# Patient Record
Sex: Female | Born: 1940 | Race: White | Hispanic: No | Smoking: Never smoker
Health system: Southern US, Community
[De-identification: ages and names within clinical notes are randomized; demographics above are authoritative.]

## PROBLEM LIST (undated history)

## (undated) DIAGNOSIS — I509 Heart failure, unspecified: Secondary | ICD-10-CM

## (undated) DIAGNOSIS — M1990 Unspecified osteoarthritis, unspecified site: Secondary | ICD-10-CM

## (undated) DIAGNOSIS — I2510 Atherosclerotic heart disease of native coronary artery without angina pectoris: Secondary | ICD-10-CM

## (undated) DIAGNOSIS — N289 Disorder of kidney and ureter, unspecified: Secondary | ICD-10-CM

## (undated) DIAGNOSIS — E119 Type 2 diabetes mellitus without complications: Secondary | ICD-10-CM

## (undated) DIAGNOSIS — G4730 Sleep apnea, unspecified: Secondary | ICD-10-CM

## (undated) DIAGNOSIS — F329 Major depressive disorder, single episode, unspecified: Secondary | ICD-10-CM

## (undated) DIAGNOSIS — R609 Edema, unspecified: Secondary | ICD-10-CM

## (undated) DIAGNOSIS — I10 Essential (primary) hypertension: Secondary | ICD-10-CM

## (undated) DIAGNOSIS — F410 Panic disorder [episodic paroxysmal anxiety] without agoraphobia: Secondary | ICD-10-CM

## (undated) DIAGNOSIS — N189 Chronic kidney disease, unspecified: Secondary | ICD-10-CM

## (undated) DIAGNOSIS — F32A Depression, unspecified: Secondary | ICD-10-CM

## (undated) DIAGNOSIS — I639 Cerebral infarction, unspecified: Secondary | ICD-10-CM

## (undated) DIAGNOSIS — IMO0001 Reserved for inherently not codable concepts without codable children: Secondary | ICD-10-CM

## (undated) DIAGNOSIS — R0601 Orthopnea: Secondary | ICD-10-CM

## (undated) HISTORY — PX: CORONARY ANGIOPLASTY: SHX604

## (undated) HISTORY — PX: TONSILLECTOMY: SUR1361

## (undated) HISTORY — PX: NO PAST SURGERIES: SHX2092

---

## 2005-10-25 ENCOUNTER — Emergency Department: Payer: Self-pay | Admitting: Unknown Physician Specialty

## 2005-10-25 ENCOUNTER — Other Ambulatory Visit: Payer: Self-pay

## 2008-08-14 ENCOUNTER — Encounter: Payer: Self-pay | Admitting: Unknown Physician Specialty

## 2008-08-22 ENCOUNTER — Encounter: Payer: Self-pay | Admitting: Unknown Physician Specialty

## 2011-11-22 ENCOUNTER — Ambulatory Visit: Payer: Self-pay | Admitting: Family Medicine

## 2011-11-23 ENCOUNTER — Ambulatory Visit: Payer: Self-pay | Admitting: Family Medicine

## 2011-12-24 ENCOUNTER — Ambulatory Visit: Payer: Self-pay | Admitting: Family Medicine

## 2012-01-21 ENCOUNTER — Ambulatory Visit: Payer: Self-pay | Admitting: Family Medicine

## 2015-03-11 ENCOUNTER — Ambulatory Visit
Admit: 2015-03-11 | Disposition: A | Discharge status: Home / Self Care | Payer: Self-pay | Attending: Nephrology | Admitting: Nephrology

## 2016-06-07 ENCOUNTER — Encounter: Payer: Self-pay | Admitting: *Deleted

## 2016-06-15 ENCOUNTER — Ambulatory Visit
Admission: RE | Admit: 2016-06-15 | Discharge: 2016-06-15 | Disposition: A | Discharge status: Home / Self Care | Payer: Medicare HMO | Source: Ambulatory Visit | Attending: Ophthalmology | Admitting: Ophthalmology

## 2016-06-15 ENCOUNTER — Ambulatory Visit: Payer: Medicare HMO | Admitting: Anesthesiology

## 2016-06-15 ENCOUNTER — Encounter
Admission: RE | Disposition: A | Discharge status: Home / Self Care | Payer: Self-pay | Source: Ambulatory Visit | Attending: Ophthalmology

## 2016-06-15 ENCOUNTER — Encounter: Payer: Self-pay | Admitting: *Deleted

## 2016-06-15 DIAGNOSIS — F419 Anxiety disorder, unspecified: Secondary | ICD-10-CM | POA: Insufficient documentation

## 2016-06-15 DIAGNOSIS — Z87891 Personal history of nicotine dependence: Secondary | ICD-10-CM | POA: Insufficient documentation

## 2016-06-15 DIAGNOSIS — M1990 Unspecified osteoarthritis, unspecified site: Secondary | ICD-10-CM | POA: Insufficient documentation

## 2016-06-15 DIAGNOSIS — H2511 Age-related nuclear cataract, right eye: Secondary | ICD-10-CM | POA: Diagnosis not present

## 2016-06-15 DIAGNOSIS — G4730 Sleep apnea, unspecified: Secondary | ICD-10-CM | POA: Insufficient documentation

## 2016-06-15 DIAGNOSIS — I10 Essential (primary) hypertension: Secondary | ICD-10-CM | POA: Diagnosis not present

## 2016-06-15 DIAGNOSIS — I509 Heart failure, unspecified: Secondary | ICD-10-CM | POA: Insufficient documentation

## 2016-06-15 DIAGNOSIS — E1165 Type 2 diabetes mellitus with hyperglycemia: Secondary | ICD-10-CM | POA: Insufficient documentation

## 2016-06-15 DIAGNOSIS — E7800 Pure hypercholesterolemia, unspecified: Secondary | ICD-10-CM | POA: Insufficient documentation

## 2016-06-15 DIAGNOSIS — F329 Major depressive disorder, single episode, unspecified: Secondary | ICD-10-CM | POA: Insufficient documentation

## 2016-06-15 DIAGNOSIS — Z8673 Personal history of transient ischemic attack (TIA), and cerebral infarction without residual deficits: Secondary | ICD-10-CM | POA: Diagnosis not present

## 2016-06-15 DIAGNOSIS — Z955 Presence of coronary angioplasty implant and graft: Secondary | ICD-10-CM | POA: Insufficient documentation

## 2016-06-15 DIAGNOSIS — I2510 Atherosclerotic heart disease of native coronary artery without angina pectoris: Secondary | ICD-10-CM | POA: Diagnosis not present

## 2016-06-15 HISTORY — DX: Depression, unspecified: F32.A

## 2016-06-15 HISTORY — DX: Essential (primary) hypertension: I10

## 2016-06-15 HISTORY — DX: Sleep apnea, unspecified: G47.30

## 2016-06-15 HISTORY — DX: Orthopnea: R06.01

## 2016-06-15 HISTORY — DX: Heart failure, unspecified: I50.9

## 2016-06-15 HISTORY — DX: Unspecified osteoarthritis, unspecified site: M19.90

## 2016-06-15 HISTORY — DX: Panic disorder (episodic paroxysmal anxiety): F41.0

## 2016-06-15 HISTORY — DX: Major depressive disorder, single episode, unspecified: F32.9

## 2016-06-15 HISTORY — DX: Type 2 diabetes mellitus without complications: E11.9

## 2016-06-15 HISTORY — DX: Chronic kidney disease, unspecified: N18.9

## 2016-06-15 HISTORY — DX: Edema, unspecified: R60.9

## 2016-06-15 HISTORY — PX: CATARACT EXTRACTION W/PHACO: SHX586

## 2016-06-15 HISTORY — DX: Cerebral infarction, unspecified: I63.9

## 2016-06-15 HISTORY — DX: Atherosclerotic heart disease of native coronary artery without angina pectoris: I25.10

## 2016-06-15 HISTORY — DX: Reserved for inherently not codable concepts without codable children: IMO0001

## 2016-06-15 LAB — GLUCOSE, CAPILLARY: Glucose-Capillary: 203 mg/dL — ABNORMAL HIGH (ref 65–99)

## 2016-06-15 SURGERY — PHACOEMULSIFICATION, CATARACT, WITH IOL INSERTION
Anesthesia: Monitor Anesthesia Care | Site: Eye | Laterality: Right | Wound class: Clean

## 2016-06-15 MED ORDER — NA CHONDROIT SULF-NA HYALURON 40-17 MG/ML IO SOLN
INTRAOCULAR | Status: DC | PRN
  Administered 2016-06-15: 1 mL via INTRAOCULAR

## 2016-06-15 MED ORDER — SODIUM CHLORIDE 0.9 % IV SOLN
INTRAVENOUS | Status: DC
  Administered 2016-06-15: 11:00:00 via INTRAVENOUS

## 2016-06-15 MED ORDER — ARMC OPHTHALMIC DILATING GEL
1.0000 "application " | OPHTHALMIC | Status: AC | PRN
  Administered 2016-06-15 (×2): 1 via OPHTHALMIC

## 2016-06-15 MED ORDER — POVIDONE-IODINE 5 % OP SOLN
OPHTHALMIC | Status: AC
  Filled 2016-06-15: qty 30

## 2016-06-15 MED ORDER — MOXIFLOXACIN HCL 0.5 % OP SOLN: 1.0000 [drp] | OPHTHALMIC | Status: DC | PRN

## 2016-06-15 MED ORDER — ONDANSETRON HCL 4 MG/2ML IJ SOLN
INTRAMUSCULAR | Status: DC | PRN
  Administered 2016-06-15: 4 mg via INTRAVENOUS

## 2016-06-15 MED ORDER — CEFUROXIME OPHTHALMIC INJECTION 1 MG/0.1 ML
INJECTION | OPHTHALMIC | Status: DC | PRN
  Administered 2016-06-15: 0.1 mL via INTRACAMERAL

## 2016-06-15 MED ORDER — MOXIFLOXACIN HCL 0.5 % OP SOLN
OPHTHALMIC | Status: DC | PRN
  Administered 2016-06-15: 1 [drp] via OPHTHALMIC

## 2016-06-15 MED ORDER — LIDOCAINE HCL (PF) 4 % IJ SOLN
INTRAMUSCULAR | Status: AC
  Filled 2016-06-15: qty 5

## 2016-06-15 MED ORDER — LIDOCAINE HCL (PF) 1 % IJ SOLN
INTRAMUSCULAR | Status: AC
  Filled 2016-06-15: qty 2

## 2016-06-15 MED ORDER — EPINEPHRINE HCL 1 MG/ML IJ SOLN
INTRAOCULAR | Status: DC | PRN
  Administered 2016-06-15: 1 mL via OPHTHALMIC

## 2016-06-15 MED ORDER — CARBACHOL 0.01 % IO SOLN
INTRAOCULAR | Status: DC | PRN
  Administered 2016-06-15: 0.5 mL via INTRAOCULAR

## 2016-06-15 MED ORDER — ARMC OPHTHALMIC DILATING GEL
OPHTHALMIC | Status: DC
Start: 2016-06-15 — End: 2016-06-15
  Filled 2016-06-15: qty 0.25

## 2016-06-15 MED ORDER — TETRACAINE HCL 0.5 % OP SOLN
1.0000 [drp] | Freq: Once | OPHTHALMIC | Status: AC
  Administered 2016-06-15: 1 [drp] via OPHTHALMIC

## 2016-06-15 MED ORDER — FENTANYL CITRATE (PF) 100 MCG/2ML IJ SOLN
INTRAMUSCULAR | Status: DC | PRN
  Administered 2016-06-15: 50 ug via INTRAVENOUS

## 2016-06-15 MED ORDER — DEXMEDETOMIDINE HCL 200 MCG/2ML IV SOLN
INTRAVENOUS | Status: DC | PRN
  Administered 2016-06-15: 350 ug via INTRAVENOUS

## 2016-06-15 MED ORDER — EPINEPHRINE HCL 1 MG/ML IJ SOLN
INTRAMUSCULAR | Status: AC
  Filled 2016-06-15: qty 1

## 2016-06-15 MED ORDER — POVIDONE-IODINE 5 % OP SOLN
1.0000 "application " | Freq: Once | OPHTHALMIC | Status: AC
  Administered 2016-06-15: 1 via OPHTHALMIC

## 2016-06-15 MED ORDER — MOXIFLOXACIN HCL 0.5 % OP SOLN
OPHTHALMIC | Status: AC
  Filled 2016-06-15: qty 3

## 2016-06-15 SURGICAL SUPPLY — 21 items
CANNULA ANT/CHMB 27GA (MISCELLANEOUS) ×3 IMPLANT
CUP MEDICINE 2OZ PLAST GRAD ST (MISCELLANEOUS) ×3 IMPLANT
GLOVE BIO SURGEON STRL SZ8 (GLOVE) ×3 IMPLANT
GLOVE BIOGEL M 6.5 STRL (GLOVE) ×3 IMPLANT
GLOVE SURG LX 8.0 MICRO (GLOVE) ×2
GLOVE SURG LX STRL 8.0 MICRO (GLOVE) ×1 IMPLANT
GOWN STRL REUS W/ TWL LRG LVL3 (GOWN DISPOSABLE) ×2 IMPLANT
GOWN STRL REUS W/TWL LRG LVL3 (GOWN DISPOSABLE) ×4
LENS IOL TECNIS ITEC 19.5 (Intraocular Lens) ×3 IMPLANT
PACK CATARACT (MISCELLANEOUS) ×3 IMPLANT
PACK CATARACT BRASINGTON LX (MISCELLANEOUS) ×3 IMPLANT
PACK EYE AFTER SURG (MISCELLANEOUS) ×3 IMPLANT
SOL BSS BAG (MISCELLANEOUS) ×3
SOL PREP PVP 2OZ (MISCELLANEOUS) ×3
SOLUTION BSS BAG (MISCELLANEOUS) ×1 IMPLANT
SOLUTION PREP PVP 2OZ (MISCELLANEOUS) ×1 IMPLANT
SYR 3ML LL SCALE MARK (SYRINGE) ×3 IMPLANT
SYR 5ML LL (SYRINGE) ×3 IMPLANT
SYR TB 1ML 27GX1/2 LL (SYRINGE) ×3 IMPLANT
WATER STERILE IRR 1000ML POUR (IV SOLUTION) ×3 IMPLANT
WIPE NON LINTING 3.25X3.25 (MISCELLANEOUS) ×3 IMPLANT

## 2016-06-15 NOTE — Op Note (Signed)
PREOPERATIVE DIAGNOSIS:  Nuclear sclerotic cataract of the right eye.   POSTOPERATIVE DIAGNOSIS: NUCLEAR SCLERTOIC CATARACT RIGHT EYE   OPERATIVE PROCEDURE:  Procedure(s): CATARACT EXTRACTION PHACO AND INTRAOCULAR LENS PLACEMENT (IOC)   SURGEON:  Galen Manila, MD.   ANESTHESIA:  Anesthesiologist: Rosaria Ferries, MD CRNA: Darrol Jump, CRNA  1.      Managed anesthesia care. 2.      Topical tetracaine drops followed by 2% Xylocaine jelly applied in the preoperative holding area.   COMPLICATIONS:  None.   TECHNIQUE:   Stop and chop   DESCRIPTION OF PROCEDURE:  The patient was examined and consented in the preoperative holding area where the aforementioned topical anesthesia was applied to the right eye and then brought back to the Operating Room where the right eye was prepped and draped in the usual sterile ophthalmic fashion and a lid speculum was placed. A paracentesis was created with the side port blade and the anterior chamber was filled with viscoelastic. A near clear corneal incision was performed with the steel keratome. A continuous curvilinear capsulorrhexis was performed with a cystotome followed by the capsulorrhexis forceps. Hydrodissection and hydrodelineation were carried out with BSS on a blunt cannula. The lens was removed in a stop and chop  technique and the remaining cortical material was removed with the irrigation-aspiration handpiece. The capsular bag was inflated with viscoelastic and the Technis [id number]  lens was placed in the capsular bag without complication. The remaining viscoelastic was removed from the eye with the irrigation-aspiration handpiece. The wounds were hydrated. The anterior chamber was flushed with Miostat and the eye was inflated to physiologic pressure. 0.1 mL of cefuroxime concentration 10 mg/mL was placed in the anterior chamber. The wounds were found to be water tight. The eye was dressed with Vigamox. The patient was given protective glasses  to wear throughout the day and a shield with which to sleep tonight. The patient was also given drops with which to begin a drop regimen today and will follow-up with me in one day.  Implant Name Type Inv. Item Serial No. Manufacturer Lot No. LRB No. Used  LENS IOL DIOP 19.5 - [id number] Intraocular Lens LENS IOL DIOP 19.5 [id number]   Right 1   Procedure(s) with comments: CATARACT EXTRACTION PHACO AND INTRAOCULAR LENS PLACEMENT (IOC) (Right) - [place] 1.53 AP% 21.4 CDE 24.41 Fluid bag lot # [id number]  Electronically signed: Breland Elders LOUIS 06/15/2016 1:03 PM

## 2016-06-15 NOTE — Anesthesia Preprocedure Evaluation (Signed)
Anesthesia Evaluation  Patient identified by MRN, date of birth, ID band Patient awake    Reviewed: Allergy & Precautions, H&P , NPO status , Patient's Chart, lab work & pertinent test results  History of Anesthesia Complications Negative for: history of anesthetic complications  Airway Mallampati: III  TM Distance: <3 FB Neck ROM: limited    Dental  (+) Poor Dentition, Chipped, Missing, Upper Dentures, Lower Dentures   Pulmonary shortness of breath, sleep apnea , former smoker,    Pulmonary exam normal breath sounds clear to auscultation       Cardiovascular Exercise Tolerance: Poor hypertension, + CAD, +CHF, + Orthopnea, + PND and + DOE  Normal cardiovascular exam Rhythm:regular Rate:Normal     Neuro/Psych PSYCHIATRIC DISORDERS Anxiety Depression CVA, Residual Symptoms    GI/Hepatic negative GI ROS, Neg liver ROS,   Endo/Other  diabetes, Poorly Controlled, Type 2  Renal/GU Renal disease  negative genitourinary   Musculoskeletal  (+) Arthritis ,   Abdominal   Peds  Hematology negative hematology ROS (+)   Anesthesia Other Findings Past Medical History: No date: Arthritis No date: CHF (congestive heart failure) (HCC) No date: Chronic kidney disease     Comment: RENAL INSUFF No date: Coronary artery disease No date: Depression No date: Diabetes mellitus without complication (HCC) No date: Edema     Comment: LEGS/FEET No date: Hypertension No date: Panic attacks No date: Shortness of breath dyspnea     Comment: WITH HEAT OR EXERTION No date: Sleep apnea     Comment: NO CPAP No date: Sleeps in sitting position due to orthopnea No date: Stroke [facility])     Comment: X 2    2000/ 2001   TIA 1990S  Past Surgical History: No date: CORONARY ANGIOPLASTY     Comment: STENTS 2001 No date: TONSILLECTOMY  BMI    Body Mass Index:  44.29 kg/m      Reproductive/Obstetrics negative OB ROS                              Anesthesia Physical Anesthesia Plan  ASA: IV  Anesthesia Plan: MAC   Post-op Pain Management:    Induction:   Airway Management Planned:   Additional Equipment:   Intra-op Plan:   Post-operative Plan:   Informed Consent: I have reviewed the patients History and Physical, chart, labs and discussed the procedure including the risks, benefits and alternatives for the proposed anesthesia with the patient or authorized representative who has indicated his/her understanding and acceptance.   Dental Advisory Given  Plan Discussed with: Anesthesiologist, CRNA and Surgeon  Anesthesia Plan Comments:         Anesthesia Quick Evaluation

## 2016-06-15 NOTE — Transfer of Care (Signed)
Immediate Anesthesia Transfer of Care Note  Patient: Emily Osborn  Procedure(s) Performed: Procedure(s) with comments: CATARACT EXTRACTION PHACO AND INTRAOCULAR LENS PLACEMENT (IOC) (Right) - [place] 1.53 AP% 21.4 CDE 24.41 Fluid bag lot # [id number]  Patient Location: PACU  Anesthesia Type:MAC  Level of Consciousness: awake, alert , oriented and patient cooperative  Airway & Oxygen Therapy: Patient Spontanous Breathing  Post-op Assessment: Report given to RN and Post -op Vital signs reviewed and stable  Post vital signs: Reviewed and stable  Last Vitals:  Vitals:   06/15/16 1057  BP: (!) 172/58  Pulse: 82  Resp: 18  Temp: 37.7 C    Last Pain:  Vitals:   06/15/16 1057  TempSrc: Tympanic         Complications: No apparent anesthesia complications

## 2016-06-15 NOTE — Discharge Instructions (Signed)
Eye Surgery Discharge Instructions  Expect mild scratchy sensation or mild soreness. DO NOT RUB YOUR EYE!  The day of surgery:  Minimal physical activity, but bed rest is not required  No reading, computer work, or close hand work  No bending, lifting, or straining.  May watch TV  For 24 hours:  No driving, legal decisions, or alcoholic beverages  Safety precautions  Eat anything you prefer: It is better to start with liquids, then soup then solid foods.  _____ Eye patch should be worn until postoperative exam tomorrow.  ____ Solar shield eyeglasses should be worn for comfort in the sunlight/patch while sleeping  Resume all regular medications including aspirin or Coumadin if these were discontinued prior to surgery. You may shower, bathe, shave, or wash your hair. Tylenol may be taken for mild discomfort.  Call your doctor if you experience significant pain, nausea, or vomiting, fever > 101 or other signs of infection. [phone] or [phone] Specific instructions:  Follow-up Information    Emily Osborn,Emily LOUIS, MD Follow up on 06/16/2016.   Specialty:  Ophthalmology Why:  July 26 at 10:50am Contact information: [address] [place] [address] [phone]

## 2016-06-15 NOTE — H&P (Signed)
  All labs reviewed. Abnormal studies sent to patients PCP when indicated.  Previous H&P reviewed, patient examined, there are NO CHANGES.  Emily Osborn LOUIS7/25/201712:01 PM

## 2016-06-15 NOTE — Anesthesia Postprocedure Evaluation (Signed)
Anesthesia Post Note  Patient: Kristanna Maden  Procedure(s) Performed: Procedure(s) (LRB): CATARACT EXTRACTION PHACO AND INTRAOCULAR LENS PLACEMENT (IOC) (Right)  Patient location during evaluation: PACU Anesthesia Type: MAC Level of consciousness: awake and alert Pain management: pain level controlled Vital Signs Assessment: post-procedure vital signs reviewed and stable Respiratory status: spontaneous breathing, nonlabored ventilation, respiratory function stable and patient connected to nasal cannula oxygen Cardiovascular status: stable and blood pressure returned to baseline Anesthetic complications: no    Last Vitals:  Vitals:   06/15/16 1300 06/15/16 1323  BP: (!) 184/69 (!) 161/64  Pulse: 81 81  Resp: 18   Temp: 36.8 C     Last Pain:  Vitals:   06/15/16 1057  TempSrc: Tympanic                 Cleda Mccreedy Bayne Fosnaugh

## 2016-08-26 ENCOUNTER — Observation Stay
Admission: EM | Admit: 2016-08-26 | Discharge: 2016-08-27 | Disposition: A | Discharge status: Home / Self Care | Payer: Medicare HMO | Attending: Internal Medicine | Admitting: Internal Medicine

## 2016-08-26 ENCOUNTER — Observation Stay: Admit: 2016-08-26 | Payer: Medicare HMO

## 2016-08-26 ENCOUNTER — Observation Stay: Payer: Medicare HMO

## 2016-08-26 ENCOUNTER — Encounter: Payer: Self-pay | Admitting: Emergency Medicine

## 2016-08-26 ENCOUNTER — Emergency Department: Payer: Medicare HMO

## 2016-08-26 DIAGNOSIS — I509 Heart failure, unspecified: Secondary | ICD-10-CM | POA: Diagnosis not present

## 2016-08-26 DIAGNOSIS — Z951 Presence of aortocoronary bypass graft: Secondary | ICD-10-CM | POA: Insufficient documentation

## 2016-08-26 DIAGNOSIS — E785 Hyperlipidemia, unspecified: Secondary | ICD-10-CM | POA: Diagnosis not present

## 2016-08-26 DIAGNOSIS — I639 Cerebral infarction, unspecified: Secondary | ICD-10-CM

## 2016-08-26 DIAGNOSIS — M1990 Unspecified osteoarthritis, unspecified site: Secondary | ICD-10-CM | POA: Insufficient documentation

## 2016-08-26 DIAGNOSIS — I130 Hypertensive heart and chronic kidney disease with heart failure and stage 1 through stage 4 chronic kidney disease, or unspecified chronic kidney disease: Secondary | ICD-10-CM | POA: Diagnosis not present

## 2016-08-26 DIAGNOSIS — Z87891 Personal history of nicotine dependence: Secondary | ICD-10-CM | POA: Diagnosis not present

## 2016-08-26 DIAGNOSIS — F329 Major depressive disorder, single episode, unspecified: Secondary | ICD-10-CM | POA: Insufficient documentation

## 2016-08-26 DIAGNOSIS — G4730 Sleep apnea, unspecified: Secondary | ICD-10-CM | POA: Diagnosis not present

## 2016-08-26 DIAGNOSIS — R531 Weakness: Secondary | ICD-10-CM | POA: Diagnosis present

## 2016-08-26 DIAGNOSIS — E1122 Type 2 diabetes mellitus with diabetic chronic kidney disease: Secondary | ICD-10-CM | POA: Diagnosis not present

## 2016-08-26 DIAGNOSIS — Z8673 Personal history of transient ischemic attack (TIA), and cerebral infarction without residual deficits: Secondary | ICD-10-CM | POA: Diagnosis not present

## 2016-08-26 DIAGNOSIS — I6522 Occlusion and stenosis of left carotid artery: Secondary | ICD-10-CM | POA: Insufficient documentation

## 2016-08-26 DIAGNOSIS — Z794 Long term (current) use of insulin: Secondary | ICD-10-CM | POA: Insufficient documentation

## 2016-08-26 DIAGNOSIS — Z66 Do not resuscitate: Secondary | ICD-10-CM | POA: Diagnosis not present

## 2016-08-26 DIAGNOSIS — I2510 Atherosclerotic heart disease of native coronary artery without angina pectoris: Secondary | ICD-10-CM | POA: Insufficient documentation

## 2016-08-26 DIAGNOSIS — M25561 Pain in right knee: Secondary | ICD-10-CM | POA: Diagnosis not present

## 2016-08-26 DIAGNOSIS — R930 Abnormal findings on diagnostic imaging of skull and head, not elsewhere classified: Secondary | ICD-10-CM | POA: Diagnosis not present

## 2016-08-26 LAB — CBC WITH DIFFERENTIAL/PLATELET
BASOS ABS: 0.1 10*3/uL (ref 0–0.1)
Basophils Relative: 1 %
EOS PCT: 4 %
Eosinophils Absolute: 0.4 10*3/uL (ref 0–0.7)
HCT: 39.2 % (ref 35.0–47.0)
Hemoglobin: 12.9 g/dL (ref 12.0–16.0)
LYMPHS ABS: 1.5 10*3/uL (ref 1.0–3.6)
LYMPHS PCT: 14 %
MCH: 28.8 pg (ref 26.0–34.0)
MCHC: 33 g/dL (ref 32.0–36.0)
MCV: 87.4 fL (ref 80.0–100.0)
MONO ABS: 0.7 10*3/uL (ref 0.2–0.9)
MONOS PCT: 7 %
Neutro Abs: 7.6 10*3/uL — ABNORMAL HIGH (ref 1.4–6.5)
Neutrophils Relative %: 74 %
PLATELETS: 180 10*3/uL (ref 150–440)
RBC: 4.48 MIL/uL (ref 3.80–5.20)
RDW: 14.5 % (ref 11.5–14.5)
WBC: 10.2 10*3/uL (ref 3.6–11.0)

## 2016-08-26 LAB — COMPREHENSIVE METABOLIC PANEL
ALT: 15 U/L (ref 14–54)
ANION GAP: 9 (ref 5–15)
AST: 19 U/L (ref 15–41)
Albumin: 3.4 g/dL — ABNORMAL LOW (ref 3.5–5.0)
Alkaline Phosphatase: 80 U/L (ref 38–126)
BILIRUBIN TOTAL: 0.5 mg/dL (ref 0.3–1.2)
BUN: 37 mg/dL — ABNORMAL HIGH (ref 6–20)
CHLORIDE: 110 mmol/L (ref 101–111)
CO2: 21 mmol/L — ABNORMAL LOW (ref 22–32)
Calcium: 8.8 mg/dL — ABNORMAL LOW (ref 8.9–10.3)
Creatinine, Ser: 3.21 mg/dL — ABNORMAL HIGH (ref 0.44–1.00)
GFR, EST AFRICAN AMERICAN: 15 mL/min — AB (ref >60)
GFR, EST NON AFRICAN AMERICAN: 13 mL/min — AB (ref >60)
Glucose, Bld: 234 mg/dL — ABNORMAL HIGH (ref 65–99)
POTASSIUM: 4.2 mmol/L (ref 3.5–5.1)
Sodium: 140 mmol/L (ref 135–145)
TOTAL PROTEIN: 7.2 g/dL (ref 6.5–8.1)

## 2016-08-26 LAB — URINALYSIS COMPLETE WITH MICROSCOPIC (ARMC ONLY)
BILIRUBIN URINE: NEGATIVE
Bacteria, UA: NONE SEEN
Hgb urine dipstick: NEGATIVE
Ketones, ur: NEGATIVE mg/dL
LEUKOCYTES UA: NEGATIVE
NITRITE: NEGATIVE
Protein, ur: >500 mg/dL — AB
RBC / HPF: NONE SEEN RBC/hpf (ref 0–5)
Specific Gravity, Urine: 1.014 (ref 1.005–1.030)
pH: 6 (ref 5.0–8.0)

## 2016-08-26 LAB — SEDIMENTATION RATE: SED RATE: 60 mm/h — AB (ref 0–30)

## 2016-08-26 LAB — GLUCOSE, CAPILLARY
Glucose-Capillary: 225 mg/dL — ABNORMAL HIGH (ref 65–99)
Glucose-Capillary: 220 mg/dL — ABNORMAL HIGH (ref 65–99)
Glucose-Capillary: 389 mg/dL — ABNORMAL HIGH (ref 65–99)

## 2016-08-26 LAB — LACTIC ACID, PLASMA
LACTIC ACID, VENOUS: 1.1 mmol/L (ref 0.5–1.9)
LACTIC ACID, VENOUS: 1.6 mmol/L (ref 0.5–1.9)

## 2016-08-26 LAB — CK: Total CK: 41 U/L (ref 38–234)

## 2016-08-26 LAB — TROPONIN I: TROPONIN I: 0.03 ng/mL — AB (ref <0.03)

## 2016-08-26 MED ORDER — DOCUSATE SODIUM 100 MG PO CAPS: 100.0000 mg | ORAL_CAPSULE | Freq: Every day | ORAL | Status: DC | PRN

## 2016-08-26 MED ORDER — BUPIVACAINE HCL (PF) 0.5 % IJ SOLN
10.0000 mL | Freq: Once | INTRAMUSCULAR | Status: AC
  Administered 2016-08-26: 16:00:00 10 mL
  Filled 2016-08-26 (×2): qty 10

## 2016-08-26 MED ORDER — OXYCODONE HCL 5 MG PO TABS: 15.0000 mg | ORAL_TABLET | Freq: Two times a day (BID) | ORAL | Status: DC | PRN

## 2016-08-26 MED ORDER — SODIUM CHLORIDE 0.9% FLUSH
3.0000 mL | Freq: Two times a day (BID) | INTRAVENOUS | Status: DC
  Administered 2016-08-26 – 2016-08-27 (×3): 3 mL via INTRAVENOUS

## 2016-08-26 MED ORDER — ASPIRIN EC 81 MG PO TBEC
81.0000 mg | DELAYED_RELEASE_TABLET | Freq: Every day | ORAL | Status: DC
  Filled 2016-08-26: qty 1

## 2016-08-26 MED ORDER — PREDNISONE 20 MG PO TABS
30.0000 mg | ORAL_TABLET | Freq: Once | ORAL | Status: AC
  Administered 2016-08-26: 30 mg via ORAL
  Filled 2016-08-26: qty 1

## 2016-08-26 MED ORDER — FUROSEMIDE 40 MG PO TABS
40.0000 mg | ORAL_TABLET | Freq: Every day | ORAL | Status: DC
  Administered 2016-08-26: 13:00:00 40 mg via ORAL
  Filled 2016-08-26 (×2): qty 1

## 2016-08-26 MED ORDER — AMLODIPINE BESYLATE 5 MG PO TABS
10.0000 mg | ORAL_TABLET | Freq: Every day | ORAL | Status: DC
  Administered 2016-08-26: 13:00:00 10 mg via ORAL
  Filled 2016-08-26 (×2): qty 2

## 2016-08-26 MED ORDER — LORATADINE 10 MG PO TABS: 10.0000 mg | ORAL_TABLET | Freq: Every day | ORAL | Status: DC | PRN

## 2016-08-26 MED ORDER — ENOXAPARIN SODIUM 40 MG/0.4ML ~~LOC~~ SOLN
40.0000 mg | Freq: Every day | SUBCUTANEOUS | Status: DC
  Administered 2016-08-26: 21:00:00 40 mg via SUBCUTANEOUS
  Filled 2016-08-26: qty 0.4

## 2016-08-26 MED ORDER — PRAVASTATIN SODIUM 40 MG PO TABS
80.0000 mg | ORAL_TABLET | Freq: Every day | ORAL | Status: DC
  Administered 2016-08-26: 21:00:00 80 mg via ORAL
  Filled 2016-08-26: qty 2

## 2016-08-26 MED ORDER — METHYLPREDNISOLONE ACETATE 40 MG/ML IJ SUSP
80.0000 mg | Freq: Once | INTRAMUSCULAR | Status: AC
  Administered 2016-08-26: 16:00:00 80 mg via INTRA_ARTICULAR
  Filled 2016-08-26: qty 2

## 2016-08-26 MED ORDER — SODIUM CHLORIDE 0.9 % IV BOLUS (SEPSIS)
500.0000 mL | Freq: Once | INTRAVENOUS | Status: AC
  Administered 2016-08-26: 500 mL via INTRAVENOUS

## 2016-08-26 MED ORDER — INSULIN ASPART 100 UNIT/ML ~~LOC~~ SOLN
0.0000 [IU] | Freq: Every day | SUBCUTANEOUS | Status: DC
  Administered 2016-08-26: 21:00:00 5 [IU] via SUBCUTANEOUS
  Filled 2016-08-26: qty 5

## 2016-08-26 MED ORDER — ASPIRIN 81 MG PO CHEW
324.0000 mg | CHEWABLE_TABLET | Freq: Once | ORAL | Status: AC
  Administered 2016-08-26: 324 mg via ORAL
  Filled 2016-08-26: qty 4

## 2016-08-26 MED ORDER — GLIPIZIDE 5 MG PO TABS
10.0000 mg | ORAL_TABLET | Freq: Two times a day (BID) | ORAL | Status: DC
  Administered 2016-08-26: 10 mg via ORAL
  Filled 2016-08-26 (×2): qty 2

## 2016-08-26 MED ORDER — PAROXETINE HCL ER 12.5 MG PO TB24
50.0000 mg | ORAL_TABLET | Freq: Every day | ORAL | Status: DC
  Administered 2016-08-26 – 2016-08-27 (×2): 50 mg via ORAL
  Filled 2016-08-26 (×2): qty 4

## 2016-08-26 MED ORDER — ONDANSETRON HCL 4 MG/2ML IJ SOLN
4.0000 mg | Freq: Once | INTRAMUSCULAR | Status: DC
  Filled 2016-08-26: qty 2

## 2016-08-26 MED ORDER — INSULIN ASPART 100 UNIT/ML ~~LOC~~ SOLN
0.0000 [IU] | Freq: Three times a day (TID) | SUBCUTANEOUS | Status: DC
  Administered 2016-08-26 (×2): 5 [IU] via SUBCUTANEOUS
  Administered 2016-08-27: 15 [IU] via SUBCUTANEOUS
  Filled 2016-08-26: qty 15
  Filled 2016-08-26 (×2): qty 5

## 2016-08-26 MED ORDER — INSULIN GLARGINE 100 UNIT/ML ~~LOC~~ SOLN
28.0000 [IU] | Freq: Every day | SUBCUTANEOUS | Status: DC
  Administered 2016-08-26: 28 [IU] via SUBCUTANEOUS
  Filled 2016-08-26 (×2): qty 0.28

## 2016-08-26 MED ORDER — ACETAMINOPHEN 325 MG PO TABS: 650.0000 mg | ORAL_TABLET | Freq: Four times a day (QID) | ORAL | Status: DC | PRN

## 2016-08-26 MED ORDER — LOSARTAN POTASSIUM 50 MG PO TABS
100.0000 mg | ORAL_TABLET | Freq: Every day | ORAL | Status: DC
  Administered 2016-08-26 – 2016-08-27 (×2): 100 mg via ORAL
  Filled 2016-08-26 (×2): qty 2

## 2016-08-26 MED ORDER — SODIUM BICARBONATE 650 MG PO TABS
650.0000 mg | ORAL_TABLET | Freq: Two times a day (BID) | ORAL | Status: DC
  Administered 2016-08-26 – 2016-08-27 (×3): 650 mg via ORAL
  Filled 2016-08-26 (×3): qty 1

## 2016-08-26 MED ORDER — ACETAMINOPHEN 650 MG RE SUPP: 650.0000 mg | Freq: Four times a day (QID) | RECTAL | Status: DC | PRN

## 2016-08-26 NOTE — Consult Note (Signed)
ORTHOPAEDIC CONSULTATION  REQUESTING PHYSICIAN: Alford Highland, MD  Chief Complaint:   Right knee pain.  History of Present Illness: Emily Osborn is a 75 y.o. female with multiple medical problems including diabetes, coronary artery disease, chronic renal insufficiency, congestive heart failure, obesity, stroke, hypertension, and anxiety/depression who was noticing some discomfort in her right knee for the past several days. While performing [employer] yesterday, she felt her knee buckle on her and she experienced some increased pain in the medial aspect of her right knee. Apparently, she became stuck in her car overnight and was unable to get out. She was found this morning and brought to the emergency room where she was complaining of increased pain in her right knee. Concern was raised that she may have had a stroke, so she was admitted for further evaluation and treatment. Because of her knee pain, orthopedics was consulted. The patient notes some improvement in her knee pain since this morning at the time of my visit. She still complains of discomfort in the medial aspect of her knee. She denies any numbness or paresthesias down her leg, and denies any swelling or erythema around the knee. She does not recall any specific injury to the knee, although the incident she described as outlined above.  Past Medical History:  Diagnosis Date  . Arthritis   . CHF (congestive heart failure) (HCC)   . Chronic kidney disease    RENAL INSUFF  . Coronary artery disease   . Depression   . Diabetes mellitus without complication (HCC)   . Edema    LEGS/FEET  . Hypertension   . Panic attacks   . Shortness of breath dyspnea    WITH HEAT OR EXERTION  . Sleep apnea    NO CPAP  . Sleeps in sitting position due to orthopnea   . Stroke (HCC)    X 2    2000/ 2001   TIA 1990S   Past Surgical History:  Procedure Laterality Date  .  CATARACT EXTRACTION W/PHACO Right 06/15/2016   Procedure: CATARACT EXTRACTION PHACO AND INTRAOCULAR LENS PLACEMENT (IOC);  Surgeon: Galen Manila, MD;  Location: [facility] ORS;  Service: Ophthalmology;  Laterality: Right;  [place] 1.53 AP% 21.4 CDE 24.41 Fluid bag lot # [id number]  . CORONARY ANGIOPLASTY     STENTS 2001  . TONSILLECTOMY     Social History   Social History  . Marital status: Widowed    Spouse name: N/A  . Number of children: N/A  . Years of education: N/A   Social History Main Topics  . Smoking status: Former Games developer  . Smokeless tobacco: Never Used  . Alcohol use No  . Drug use: No  . Sexual activity: Not Asked   Other Topics Concern  . None   Social History Narrative  . None   Family History  Problem Relation Age of Onset  . Diabetes Mother   . Alzheimer's disease Mother   . Diabetes Father   . CAD Father    No Known Allergies Prior to Admission medications   Medication Sig Start Date End Date Taking? Authorizing Provider  acetaminophen (TYLENOL) 500 MG tablet Take 500 mg by mouth every 6 (six) hours as needed for mild pain.   Yes Historical Provider, MD  amLODipine (NORVASC) 10 MG tablet Take 10 mg by mouth daily.   Yes Historical Provider, MD  docusate sodium (COLACE) 100 MG capsule Take 100 mg by mouth daily as needed for mild constipation.   Yes Historical Provider, MD  furosemide (LASIX) 40 MG tablet Take 40 mg by mouth daily.    Yes Historical Provider, MD  glipiZIDE (GLUCOTROL) 10 MG tablet Take 1 tablet by mouth 2 (two) times daily. 03/11/16  Yes Historical Provider, MD  GuaiFENesin (MUCUS RELIEF ADULT PO) Take 400 mg by mouth daily as needed (mucus).    Yes Historical Provider, MD  LANTUS SOLOSTAR 100 UNIT/ML Solostar Pen Take 28 Units by mouth at bedtime. 03/11/16  Yes Historical Provider, MD  loratadine (CLARITIN) 10 MG tablet Take 10 mg by mouth daily as needed for allergies.   Yes Historical Provider, MD  losartan (COZAAR) 100 MG tablet Take 100 mg by  mouth daily.   Yes Historical Provider, MD  NOVOLOG FLEXPEN 100 UNIT/ML FlexPen Inject 18 Units into the skin 3 (three) times daily. 03/11/16  Yes Historical Provider, MD  PARoxetine (PAXIL-CR) 25 MG 24 hr tablet Take 2 tablets by mouth daily. 03/11/16  Yes Historical Provider, MD  pravastatin (PRAVACHOL) 80 MG tablet Take 1 tablet by mouth at bedtime.  02/28/16  Yes Historical Provider, MD  sodium bicarbonate 650 MG tablet Take 650 mg by mouth 2 (two) times daily.   Yes Historical Provider, MD  oxyCODONE (ROXICODONE) 15 MG immediate release tablet Take 1 tablet by mouth 2 (two) times daily as needed for pain.  04/05/16   Historical Provider, MD   Ct Head Wo Contrast  Result Date: 08/26/2016 CLINICAL DATA:  Patient with right lower extremity weakness. Alert and oriented. EXAM: CT HEAD WITHOUT CONTRAST TECHNIQUE: Contiguous axial images were obtained from the base of the skull through the vertex without intravenous contrast. COMPARISON:  None. FINDINGS: Brain: Ventricles and sulci are prominent compatible with atrophy. Periventricular and subcortical white matter hypodensity compatible with chronic microvascular ischemic changes. Patchy hypodensities within the left basal ganglia. No intracranial mass lesion, mass effect or hemorrhage. Vascular: Carotid atherosclerotic calcifications. Skull: Normal. Negative for fracture or focal lesion. Sinuses/Orbits: Paranasal sinuses are well aerated. Mastoid air cells unremarkable. Other: Unremarkable IMPRESSION: Patchy hypodensities within the left basal ganglia favored to represent subacute to chronic infarcts. No acute intracranial hemorrhage. Chronic microvascular ischemic changes and cortical atrophy. Electronically Signed   By: Annia Beltrew  Davis M.D.   On: 08/26/2016 08:44   [place] Carotid Bilateral  Result Date: 08/26/2016 CLINICAL DATA:  75 year old female with acute cerebral vascular accident EXAM: BILATERAL CAROTID DUPLEX ULTRASOUND TECHNIQUE: Wallace CullensGray scale imaging, color  Doppler and duplex ultrasound were performed of bilateral carotid and vertebral arteries in the neck. COMPARISON:  CT scan of the head 08/26/2016; prior duplex carotid ultrasound 01/08/2002 FINDINGS: Criteria: Quantification of carotid stenosis is based on velocity parameters that correlate the residual internal carotid diameter with NASCET-based stenosis levels, using the diameter of the distal internal carotid lumen as the denominator for stenosis measurement. The following velocity measurements were obtained: RIGHT ICA:  118/31 cm/sec CCA:  100/10 cm/sec SYSTOLIC ICA/CCA RATIO:  1.2 DIASTOLIC ICA/CCA RATIO:  3.2 ECA:  116 cm/sec LEFT ICA:  135/40 cm/sec CCA:  75/12 cm/sec SYSTOLIC ICA/CCA RATIO:  1.8 DIASTOLIC ICA/CCA RATIO:  3.4 ECA:  100 cm/sec RIGHT CAROTID ARTERY: Mild heterogeneous atherosclerotic plaque in the proximal internal carotid artery. By peak systolic velocity criteria the estimated stenosis remains less than 50%. The artery is tortuous distally. RIGHT VERTEBRAL ARTERY:  Patent with normal antegrade flow. LEFT CAROTID ARTERY: Heterogeneous and partially calcified atherosclerotic plaque in the proximal internal carotid artery. By peak systolic velocity criteria the estimated stenosis falls in the 50- 69% diameter range. There is  associated spectral broadening. LEFT VERTEBRAL ARTERY:  Patent with normal antegrade flow. IMPRESSION: 1. Mild (1-49%) stenosis proximal right internal carotid artery secondary to heterogenous atherosclerotic plaque. 2. Moderate (50-69%) stenosis proximal left internal carotid artery secondary to heterogenous and partially calcified atherosclerotic plaque. 3. Vertebral arteries are patent with normal antegrade flow. Signed, Sterling Big, MD [facility] Electronically Signed   By: Malachy Moan M.D.   On: 08/26/2016 11:54   Dg Knee Complete 4 Views Right  Result Date: 08/26/2016 CLINICAL DATA:  PT C/O  PAIN FOR 5 DAYS NOW. RIGHT KNEE PAIN. NO INJURY. MOST OF HER PAIN IS MEDIAL. PT HAS HAD PAIN PREVIOUSLY WITH THAT KNEE. EXAM: RIGHT KNEE - COMPLETE 4+ VIEW COMPARISON:  None. FINDINGS: Bones appear radiolucent. There is acute fracture or subluxation. No joint effusion. Joint space narrowing identified in the patellofemoral and medial compartments. Atherosclerotic calcification of the popliteal artery. IMPRESSION: No evidence for acute  abnormality.  Degenerative changes. Electronically Signed   By: Norva Pavlov M.D.   On: 08/26/2016 10:35    Positive ROS: All other systems have been reviewed and were otherwise negative with the exception of those mentioned in the HPI and as above.  Physical Exam: General:  Alert, no acute distress Psychiatric:  Patient is competent for consent with normal mood and affect   Cardiovascular:  No pedal edema Respiratory:  No wheezing, non-labored breathing GI:  Abdomen is soft and non-tender Skin:  No lesions in the area of chief complaint Neurologic:  Sensation intact distally Lymphatic:  No axillary or cervical lymphadenopathy  Orthopedic Exam:  Orthopedic examination is limited to the right knee and lower extremity. The patient is able to fully extend her knee and flex beyond 90 without discomfort. Skin inspection around the knee is unremarkable. There is no swelling or erythema. She has moderate tenderness to palpation over medial joint line, but only minimal tenderness to palpation along the lateral joint line. There is no peripatellar tenderness. She has no pain or laxity with varus valgus stressing. She has a negative Lachman's test and negative inter-drawer test. She has negative patella apprehension sign. She does have a positive Figure-of-4 test in that it re-creates her pain medially. She is neurovascularly intact to the right lower extremity and foot.  X-rays:  X-rays of the right knee are available for review. These films demonstrate no evidence for  fractures, lytic lesions, or significant degenerative changes. Mild degenerative changes are noted, probably involving the medial compartment.  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  Plan: The treatment options are discussed with the patient. After obtaining verbal consent, the right knee is injected sterilely using a solution of 2 cc of methylprednisolone (80 mg) and 8 cc of 0.5% Sensorcaine. The patient tolerated the procedure well. She is to receive pain medication as necessary and is advised to ice her knee frequently. She may begin physical therapy to work on gait, balance, transfer training, weightbearing as tolerated on the right lower extremity. She may need to use a cane or walker as necessary for balance.  Thank you for ask me to produce pain in the care of this most pleasant unfortunate woman. I will be happy to follow her with you.   Maryagnes Amos, MD  Beeper #:  [phone]  08/26/2016 3:41 PM

## 2016-08-26 NOTE — ED Notes (Signed)
Patient had vomiting episode on the way back from CT scan. Tan/pink vomitus noted. Lung sounds unchanged from prior assessment.

## 2016-08-26 NOTE — H&P (Signed)
[facility] [place] [facility]   PATIENT NAME: Emily Osborn    MR#:  [id number]  DATE OF BIRTH:  1941-07-31  DATE OF ADMISSION:  08/26/2016  PRIMARY CARE PHYSICIAN: Clayborn Bigness  REQUESTING/REFERRING PHYSICIAN: Dr Huel Cote  CHIEF COMPLAINT:   Chief Complaint  Patient presents with  . Weakness    HISTORY OF PRESENT ILLNESS:  Ellizabeth Osborn  is a 75 y.o. female presents with right knee pain. She states that says something wrong with her right leg. Yesterday it folded up underneath her. She was returning from the church and was unable to get out of her car secondary to right knee pain and unable to put weight on it. It was not until this morning when somebody found her that they brought her to the hospital because she didn't get out of the car. She has nothing to eat or drink overnight. She complains of knee pain when moving it or putting weight on it. When she lies flat she vomits.  PAST MEDICAL HISTORY:   Past Medical History:  Diagnosis Date  . Arthritis   . CHF (congestive heart failure) (HCC)   . Chronic kidney disease    RENAL INSUFF  . Coronary artery disease   . Depression   . Diabetes mellitus without complication (HCC)   . Edema    LEGS/FEET  . Hypertension   . Panic attacks   . Shortness of breath dyspnea    WITH HEAT OR EXERTION  . Sleep apnea    NO CPAP  . Sleeps in sitting position due to orthopnea   . Stroke (HCC)    X 2    2000/ 2001   TIA 1990S    PAST SURGICAL HISTORY:   Past Surgical History:  Procedure Laterality Date  . CATARACT EXTRACTION W/PHACO Right 06/15/2016   Procedure: CATARACT EXTRACTION PHACO AND INTRAOCULAR LENS PLACEMENT (IOC);  Surgeon: Galen Manila, MD;  Location: [facility] ORS;  Service: Ophthalmology;  Laterality: Right;  [place] 1.53 AP% 21.4 CDE 24.41 Fluid bag lot # [id number]  . CORONARY ANGIOPLASTY     STENTS 2001  . TONSILLECTOMY      SOCIAL HISTORY:   Social History  Substance Use Topics  .  Smoking status: Former Games developer  . Smokeless tobacco: Never Used  . Alcohol use No    FAMILY HISTORY:   Family History  Problem Relation Age of Onset  . Diabetes Mother   . Alzheimer's disease Mother   . Diabetes Father   . CAD Father     DRUG ALLERGIES:  No Known Allergies  REVIEW OF SYSTEMS:  CONSTITUTIONAL: No fever, Positive for fatigue. Some night sweats. EYES: Poor vision left eye. Wears glasses EARS, NOSE, AND THROAT: No tinnitus or ear pain. No sore throat. Decreased hearing. Positive for runny nose. RESPIRATORY: No cough, some shortness of breath, no wheezing or hemoptysis.  CARDIOVASCULAR: No chest pain, orthopnea, edema.  GASTROINTESTINAL: No nausea, positive vomiting if she lies flat, no diarrhea or abdominal pain. No blood in bowel movements GENITOURINARY: No dysuria, hematuria.  ENDOCRINE: No polyuria, nocturia,  HEMATOLOGY: No anemia, easy bruising or bleeding SKIN: No rash or lesion. MUSCULOSKELETAL: Right knee pain. Arthritis pains. NEUROLOGIC: No tingling, numbness, weakness.  PSYCHIATRY: Positive for depression.   MEDICATIONS AT HOME:   Prior to Admission medications   Medication Sig Start Date End Date Taking? Authorizing Provider  acetaminophen (TYLENOL) 500 MG tablet Take 500 mg by mouth every 6 (six) hours as needed for mild pain.  Yes Historical Provider, MD  amLODipine (NORVASC) 10 MG tablet Take 10 mg by mouth daily.   Yes Historical Provider, MD  docusate sodium (COLACE) 100 MG capsule Take 100 mg by mouth daily as needed for mild constipation.   Yes Historical Provider, MD  furosemide (LASIX) 40 MG tablet Take 40 mg by mouth daily.    Yes Historical Provider, MD  glipiZIDE (GLUCOTROL) 10 MG tablet Take 1 tablet by mouth 2 (two) times daily. 03/11/16  Yes Historical Provider, MD  GuaiFENesin (MUCUS RELIEF ADULT PO) Take 400 mg by mouth daily as needed (mucus).    Yes Historical Provider, MD  LANTUS SOLOSTAR 100 UNIT/ML Solostar Pen Take 28 Units by  mouth at bedtime. 03/11/16  Yes Historical Provider, MD  loratadine (CLARITIN) 10 MG tablet Take 10 mg by mouth daily as needed for allergies.   Yes Historical Provider, MD  losartan (COZAAR) 100 MG tablet Take 100 mg by mouth daily.   Yes Historical Provider, MD  NOVOLOG FLEXPEN 100 UNIT/ML FlexPen Inject 18 Units into the skin 3 (three) times daily. 03/11/16  Yes Historical Provider, MD  PARoxetine (PAXIL-CR) 25 MG 24 hr tablet Take 2 tablets by mouth daily. 03/11/16  Yes Historical Provider, MD  pravastatin (PRAVACHOL) 80 MG tablet Take 1 tablet by mouth at bedtime.  02/28/16  Yes Historical Provider, MD  sodium bicarbonate 650 MG tablet Take 650 mg by mouth 2 (two) times daily.   Yes Historical Provider, MD  oxyCODONE (ROXICODONE) 15 MG immediate release tablet Take 1 tablet by mouth 2 (two) times daily as needed for pain.  04/05/16   Historical Provider, MD      VITAL SIGNS:  Blood pressure (!) 149/68, pulse 91, temperature 98.3 F (36.8 C), temperature source Oral, resp. rate (!) 24, height 5\' 3"  (1.6 m), weight 111.2 kg (245 lb 2.4 oz), SpO2 98 %.  PHYSICAL EXAMINATION:  GENERAL:  75 y.o.-year-old Morbidly obese patient lying in the bed with no acute distress.  EYES: Pupils equal, round, reactive to light and accommodation. No scleral icterus. Extraocular muscles intact.  HEENT: Head atraumatic, normocephalic. Oropharynx and nasopharynx clear.  NECK:  Supple, no jugular venous distention. No thyroid enlargement, no tenderness.  LUNGS: Normal breath sounds bilaterally, no wheezing, rales,rhonchi or crepitation. No use of accessory muscles of respiration.  CARDIOVASCULAR: S1, S2 normal. No murmurs, rubs, or gallops.  ABDOMEN: Soft, nontender, nondistended. Bowel sounds present. No organomegaly or mass.  EXTREMITIES: 2+ lower extremity edema, cyanosis, or clubbing.  Right knee pain with flexion. Pain to palpation to the tendon insertions medially. NEUROLOGIC: Cranial nerves II through XII are  intact. Muscle strength 5/5 in all extremities. Patient able to straight leg raise bilaterally. Sensation intact. Gait not checked.  PSYCHIATRIC: The patient is alert and oriented x 3.  SKIN: No rash, lesion, or ulcer.   LABORATORY PANEL:   CBC  Recent Labs Lab 08/26/16 0748  WBC 10.2  HGB 12.9  HCT 39.2  PLT 180   ------------------------------------------------------------------------------------------------------------------  Chemistries   Recent Labs Lab 08/26/16 0748  NA 140  K 4.2  CL 110  CO2 21*  GLUCOSE 234*  BUN 37*  CREATININE 3.21*  CALCIUM 8.8*  AST 19  ALT 15  ALKPHOS 80  BILITOT 0.5   ------------------------------------------------------------------------------------------------------------------  Cardiac Enzymes  Recent Labs Lab 08/26/16 0748  TROPONINI 0.03*   ------------------------------------------------------------------------------------------------------------------  RADIOLOGY:  Ct Head Wo Contrast  Result Date: 08/26/2016 CLINICAL DATA:  Patient with right lower extremity weakness. Alert and oriented. EXAM:  CT HEAD WITHOUT CONTRAST TECHNIQUE: Contiguous axial images were obtained from the base of the skull through the vertex without intravenous contrast. COMPARISON:  None. FINDINGS: Brain: Ventricles and sulci are prominent compatible with atrophy. Periventricular and subcortical white matter hypodensity compatible with chronic microvascular ischemic changes. Patchy hypodensities within the left basal ganglia. No intracranial mass lesion, mass effect or hemorrhage. Vascular: Carotid atherosclerotic calcifications. Skull: Normal. Negative for fracture or focal lesion. Sinuses/Orbits: Paranasal sinuses are well aerated. Mastoid air cells unremarkable. Other: Unremarkable IMPRESSION: Patchy hypodensities within the left basal ganglia favored to represent subacute to chronic infarcts. No acute intracranial hemorrhage. Chronic microvascular  ischemic changes and cortical atrophy. Electronically Signed   By: Annia Belt M.D.   On: 08/26/2016 08:44    EKG:   Normal sinus rhythm 92 bpm, septal Q waves.  IMPRESSION AND PLAN:   1. Right knee pain. I will start with x-rays of the right knee. Give oral steroid. Since patient has point tenderness over tendon insertion likely an inflammation of the tendon. Case discussed with Dr. Joice Lofts orthopedic surgery to see the patient in consultation after x-rays obtained. 2. Subacute CVA seen on CT scan. Patient states that she did not have a stroke. I will give aspirin and do the stroke workup. MRI of the brain, carotid ultrasound and echocardiogram. Physical therapy evaluation. 3. 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 Give gentle IV fluid hydration 1 L. Continue diabetic medications and sliding scale. Continue him sodium bicarbonate. 4. Hyperlipidemia unspecified. On pravastatin 5. Essential hypertension continue usual medications 6. Depression. Continue Paxil   All the records are reviewed and case discussed with ED provider. Management plans discussed with the patient, and she is in agreement.  CODE STATUS: DO NOT RESUSCITATE  TOTAL TIME TAKING CARE OF THIS PATIENT: 55 minutes.    Alford Highland M.D on 08/26/2016 at 10:07 AM  Between 7am to 6pm - Pager - [phone]  After 6pm call admission pager [phone]  [facility] Office  [phone]  CC: Primary care physician; Clayborn Bigness

## 2016-08-26 NOTE — Care Management Obs Status (Signed)
MEDICARE OBSERVATION STATUS NOTIFICATION   Patient Details  Name: Emily Osborn MRN: [id number] Date of Birth: 11/06/1941   Medicare Observation Status Notification Given:   yes    Berna BueCheryl Kennedie Pardoe, RN 08/26/2016, 10:00 AM

## 2016-08-26 NOTE — ED Notes (Signed)
Patient still in US. Will send to the floor when she gets back. Floor RN, Corrie DandyMary, aware.

## 2016-08-26 NOTE — ED Notes (Signed)
Attempting to call report at this time. 

## 2016-08-26 NOTE — ED Notes (Signed)
Patient refused Zofran. Soiled blanket removed. Patient refusing to change shirt at this time.

## 2016-08-26 NOTE — Progress Notes (Signed)
PT Cancellation Note  Patient Details Name: Emily Osborn MRN: [id number] DOB: 04/24/1941   Cancelled Treatment:    Reason Eval/Treat Not Completed: Medical issues which prohibited therapy; Pt admitted with knee pain and has ortho consult pending, will await ortho notes/recs;  pt also with MRI pending due to subacute CVA seen on CT scan.  Will continue to follow.   Emily Osborn, PT 08/26/2016, 1:41 PM

## 2016-08-26 NOTE — ED Notes (Signed)
Patient transported to CT 

## 2016-08-26 NOTE — ED Provider Notes (Signed)
Time Seen: Approximately 0758  I have reviewed the triage notes  Chief Complaint: Weakness   History of Present Illness: Emily Osborn is a 75 y.o. female who states that she lives by herself and has history of multiple medical problems. Patient states that she had trouble getting out of her vehicle and describes right lower extremity weakness essentially from the hip down. States she sat a history of strokes but feels this one is different or different symptoms. She initially described weakness in both legs but states now is primarily in the right lower extremity. She apparently was stuck in her vehicle since 3 PM yesterday. She denies any upper extremity problems she denies any falls. She was found by the paper delivery person and helped her get out of the vehicle. EMS transported the patient here uneventfully. She denies any chest pain, abdominal pain, trouble with speech or swallowing, headaches, blurred vision, loss of vision etc.   Past Medical History:  Diagnosis Date  . Arthritis   . CHF (congestive heart failure) (HCC)   . Chronic kidney disease    RENAL INSUFF  . Coronary artery disease   . Depression   . Diabetes mellitus without complication (HCC)   . Edema    LEGS/FEET  . Hypertension   . Panic attacks   . Shortness of breath dyspnea    WITH HEAT OR EXERTION  . Sleep apnea    NO CPAP  . Sleeps in sitting position due to orthopnea   . Stroke (HCC)    X 2    2000/ 2001   TIA 1990S    There are no active problems to display for this patient.   Past Surgical History:  Procedure Laterality Date  . CATARACT EXTRACTION W/PHACO Right 06/15/2016   Procedure: CATARACT EXTRACTION PHACO AND INTRAOCULAR LENS PLACEMENT (IOC);  Surgeon: Galen Manila, MD;  Location: [facility] ORS;  Service: Ophthalmology;  Laterality: Right;  [place] 1.53 AP% 21.4 CDE 24.41 Fluid bag lot # [id number]  . CORONARY ANGIOPLASTY     STENTS 2001  . TONSILLECTOMY      Past Surgical History:   Procedure Laterality Date  . CATARACT EXTRACTION W/PHACO Right 06/15/2016   Procedure: CATARACT EXTRACTION PHACO AND INTRAOCULAR LENS PLACEMENT (IOC);  Surgeon: Galen Manila, MD;  Location: [facility] ORS;  Service: Ophthalmology;  Laterality: Right;  [place] 1.53 AP% 21.4 CDE 24.41 Fluid bag lot # [id number]  . CORONARY ANGIOPLASTY     STENTS 2001  . TONSILLECTOMY      Current Outpatient Rx  . Order #: [id number] Class: Historical Med  . Order #: [id number] Class: Historical Med  . Order #: [id number] Class: Historical Med  . Order #: [id number] Class: Historical Med  . Order #: [id number] Class: Historical Med  . Order #: [id number] Class: Historical Med  . Order #: [id number] Class: Historical Med  . Order #: [id number] Class: Historical Med  . Order #: [id number] Class: Historical Med  . Order #: [id number] Class: Historical Med  . Order #: [id number] Class: Historical Med  . Order #: [id number] Class: Historical Med  . Order #: [id number] Class: Historical Med    Allergies:  Review of patient's allergies indicates no known allergies.  Family History: No family history on file.  Social History: Social History  Substance Use Topics  . Smoking status: Former Games developer  . Smokeless tobacco: Never Used  . Alcohol use No     Review of Systems:   10 point review of systems was performed and was otherwise negative:  Constitutional: No fever Eyes: No visual disturbances  ENT: No sore throat, ear pain Cardiac: No chest pain Respiratory: No shortness of breath, wheezing, or stridor Abdomen: No abdominal pain, no vomiting, No diarrhea Endocrine: No weight loss, No night sweats Extremities: No peripheral edema, cyanosis Skin: No rashes, easy bruising Neurologic: Right lower extremity weakness, no trouble with speech or swollowing Urologic: No dysuria, Hematuria, or urinary frequency   Physical Exam:  ED Triage Vitals  Enc Vitals Group     BP 08/26/16 0743 (!) 161/70     Pulse Rate 08/26/16 0743 90      Resp 08/26/16 0743 (!) 23     Temp 08/26/16 0743 98.3 F (36.8 C)     Temp Source 08/26/16 0743 Oral     SpO2 08/26/16 0743 94 %     Weight 08/26/16 0744 245 lb 2.4 oz (111.2 kg)     Height 08/26/16 0744 5\' 3"  (1.6 m)     Head Circumference --      Peak Flow --      Pain Score 08/26/16 0744 3     Pain Loc --      Pain Edu? --      Excl. in GC? --     General: Awake , Alert , and Oriented times 3; GCS 15 Head: Normal cephalic , atraumatic Eyes: Pupils equal , round, reactive to light Nose/Throat: No nasal drainage, patent upper airway without erythema or exudate.  Neck: Supple, Full range of motion, No anterior adenopathy or palpable thyroid masses Lungs: Diminished at the bases without any rales or rhonchi  Heart: Regular rate, regular rhythm without murmurs , gallops , or rubs Abdomen: Soft, non tender without rebound, guarding , or rigidity; bowel sounds positive and symmetric in all 4 quadrants. No organomegaly .        Extremities: 2 plus symmetric pulses. No edema, clubbing or cyanosis Neurologic: 5 out of 5 strength in both upper extremities. Right lower extremity 1 out of 5, left lower extremity 3 out of 5 Skin: warm, dry, no rashes   Labs:   All laboratory work was reviewed including any pertinent negatives or positives listed below:  Labs Reviewed  CBC WITH DIFFERENTIAL/PLATELET  COMPREHENSIVE METABOLIC PANEL  LACTIC ACID, PLASMA  LACTIC ACID, PLASMA  URINALYSIS COMPLETEWITH MICROSCOPIC ([facility] ONLY)  CK  TROPONIN I  Reviewed the patient's laboratory work shows some renal insufficiency.   EKG:  ED ECG REPORT I, Jennye MoccasinBrian S Khalani Novoa, the attending physician, personally viewed and interpreted this ECG.  Date: 08/26/2016 EKG Time: 0742 Rate: 92 Rhythm: normal sinus rhythm QRS Axis: normal Intervals: normal ST/T Wave abnormalities: normal Conduction Disturbances: none Narrative Interpretation: unremarkable Poor R-wave progression in the anterior leads No  acute ischemic changes  Radiology:  "Ct Head Wo Contrast  Result Date: 08/26/2016 CLINICAL DATA:  Patient with right lower extremity weakness. Alert and oriented. EXAM: CT HEAD WITHOUT CONTRAST TECHNIQUE: Contiguous axial images were obtained from the base of the skull through the vertex without intravenous contrast. COMPARISON:  None. FINDINGS: Brain: Ventricles and sulci are prominent compatible with atrophy. Periventricular and subcortical white matter hypodensity compatible with chronic microvascular ischemic changes. Patchy hypodensities within the left basal ganglia. No intracranial mass lesion, mass effect or hemorrhage. Vascular: Carotid atherosclerotic calcifications. Skull: Normal. Negative for fracture or focal lesion. Sinuses/Orbits: Paranasal sinuses are well aerated. Mastoid air cells unremarkable. Other: Unremarkable IMPRESSION: Patchy hypodensities within the left basal ganglia favored to represent subacute to chronic infarcts. No acute intracranial hemorrhage. Chronic microvascular ischemic changes and cortical atrophy.  Electronically Signed   By: Annia Belt M.D.   On: 08/26/2016 08:44  "  I personally reviewed the radiologic studies   ED Course: * Patient's stay here was uneventful and she appears to have some renal insufficiency. Not sure of how long this is been occurring "and that she did not have access to food or fluids well and her car overnight I gave her IV fluid bolus for prerenal dehydration. The patient's symptoms are likely secondary to an acute cerebrovascular accident. Given the timing of initiation of her symptoms she is well outside and he TPA therapy and/or interventional treatment Her weakness was described in both lower extremities now strictly in the right and her seemed to be some improvement at least overnight  based on her description. Patient was given aspirin therapy urine emergency department Clinical Course     Assessment: Acute ischemic cerebrovascular  accident     Plan: Inpatient management           Jennye Moccasin, MD 08/26/16 [phone]

## 2016-08-26 NOTE — ED Notes (Signed)
In and out cath completed at this time. Patient tolerated well. Not able to place foley in ED at this time due to algorithm for appropriate insertion of foley. Patient meets "inappropriate reason- immobility not r/t trauma".

## 2016-08-26 NOTE — ED Notes (Signed)
Informed RN bed ready 

## 2016-08-26 NOTE — ED Triage Notes (Signed)
Per [facility], patient comes from home. Lives alone. Patient was found by the paper delivery person. Patient was found in her car. Patient states she was stuck in her car since 1500 yesterday. Patient states she was unable to get up due to lower extremity weakness. Patient A&O x4. VSS. Patient states she feels better now that her legs are straight but still c/o weakness. Foot push and pull equal and symmetrical bilaterally.

## 2016-08-26 NOTE — ED Notes (Signed)
Patient transported to radiology

## 2016-08-27 LAB — CBC
HCT: 34.8 % — ABNORMAL LOW (ref 35.0–47.0)
HEMOGLOBIN: 11.9 g/dL — AB (ref 12.0–16.0)
MCH: 29.5 pg (ref 26.0–34.0)
MCHC: 34.3 g/dL (ref 32.0–36.0)
MCV: 86.1 fL (ref 80.0–100.0)
PLATELETS: 158 10*3/uL (ref 150–440)
RBC: 4.04 MIL/uL (ref 3.80–5.20)
RDW: 13.7 % (ref 11.5–14.5)
WBC: 7.4 10*3/uL (ref 3.6–11.0)

## 2016-08-27 LAB — BASIC METABOLIC PANEL
ANION GAP: 9 (ref 5–15)
BUN: 43 mg/dL — AB (ref 6–20)
CALCIUM: 8.9 mg/dL (ref 8.9–10.3)
CO2: 21 mmol/L — ABNORMAL LOW (ref 22–32)
Chloride: 106 mmol/L (ref 101–111)
Creatinine, Ser: 3.2 mg/dL — ABNORMAL HIGH (ref 0.44–1.00)
GFR calc Af Amer: 15 mL/min — ABNORMAL LOW (ref >60)
GFR, EST NON AFRICAN AMERICAN: 13 mL/min — AB (ref >60)
GLUCOSE: 422 mg/dL — AB (ref 65–99)
Potassium: 4 mmol/L (ref 3.5–5.1)
Sodium: 136 mmol/L (ref 135–145)

## 2016-08-27 LAB — GLUCOSE, CAPILLARY: GLUCOSE-CAPILLARY: 391 mg/dL — AB (ref 65–99)

## 2016-08-27 NOTE — Progress Notes (Signed)
Pt refuses to have any more testing done today (CT head, ECHO ordered). Refused Lasix, Amlodipine, & Aspirin. Dr. Renae GlossWieting notified and aware.

## 2016-08-27 NOTE — Progress Notes (Signed)
Discharge paperwork reviewed with patient who verbalized understanding. Medications patient received while in the hospital reviewed with patient at patient request and printed copy off for patient. Ms. Emily Osborn verbalized understanding. Patient's friend to transport home.

## 2016-08-27 NOTE — Progress Notes (Signed)
Subjective: The patient notes that her right knee pain is much improved today, and feels that the steroid injection was quite beneficial. She states that she has gotten up approximately a dozen times during the course of the night to use the bedside commode and has not had any difficulties in regards to her right knee.   Objective: Vital signs in last 24 hours: Temp:  [98 F (36.7 C)-98.8 F (37.1 C)] 98 F (36.7 C) (10/06 0440) Pulse Rate:  [87-102] 95 (10/06 0440) Resp:  [18-24] 18 (10/06 0440) BP: (137-171)/(67-99) 159/84 (10/06 0440) SpO2:  [96 %-98 %] 97 % (10/06 0440) Weight:  [114.3 kg (252 lb)] 114.3 kg (252 lb) (10/05 1200)  Intake/Output from previous day: 10/05 0701 - 10/06 0700 In: 500 [IV Piggyback:500] Out: 900 [Urine:900] Intake/Output this shift: No intake/output data recorded.   Recent Labs  08/26/16 0748 08/27/16 0426  HGB 12.9 11.9*    Recent Labs  08/26/16 0748 08/27/16 0426  WBC 10.2 7.4  RBC 4.48 4.04  HCT 39.2 34.8*  PLT 180 158    Recent Labs  08/26/16 0748 08/27/16 0426  NA 140 136  K 4.2 4.0  CL 110 106  CO2 21* 21*  BUN 37* 43*  CREATININE 3.21* 3.20*  GLUCOSE 234* 422*  CALCIUM 8.8* 8.9   No results for input(s): LABPT, INR in the last 72 hours.  Physical Exam: Orthopedic examination again is limited to the right knee. There are no skin abnormalities or swelling. She is able to extend her knee fully and flex beyond 100 without any discomfort. She has only minimal tenderness to palpation along the medial joint line, no tenderness laterally. There is no ligamentous laxity. Patella tracks well. She is neurovascularly intact to the right lower extremity and foot.  Assessment: Apparent right knee strain with mild underlying degenerative joint disease and probable degenerative meniscal tear, presently improve symptomatically.  Plan: Treatment options are reviewed with the patient. At this point, the patient is feeling much better in  regards to her knee pain. Therefore, she is to be mobilized with physical therapy, weightbearing as tolerated on the right lower extremity. She uses a walker at home on a regular basis, so she should be mobilized with a walker. She is cleared from an orthopedic standpoint for discharge home.  Thank you for ask me to participate in the care of this most pleasant woman. She will follow up with me on an as necessary basis.   Excell SeltzerJohn J Enslee Bibbins 08/27/2016, 7:58 AM

## 2016-08-27 NOTE — Progress Notes (Signed)
Patient's home medication returned to patient from being stored in pharmacy during admission.   When I was leaving the room patient was opening a bottle. I explained to the patient she is not supposed to take home medications while in the hospital. She stated "I don't care I'm taking my glipizide because I am going to get lunch on the way home" and placed the pill in her mouth. Patient refused to take glipizide this morning when scheduled with daily medications.

## 2016-08-27 NOTE — Progress Notes (Signed)
Inpatient Diabetes Program Recommendations  AACE/ADA: New Consensus Statement on Inpatient Glycemic Control (2015)  Target Ranges:  Prepandial:   less than 140 mg/dL      Peak postprandial:   less than 180 mg/dL (1-2 hours)      Critically ill patients:  140 - 180 mg/dL  Results for Emily Osborn, [facility] (MRN [id number]) as of 08/27/2016 07:50  Ref. Range 08/26/2016 12:01 08/26/2016 16:42 08/26/2016 20:52 08/27/2016 07:33  Glucose-Capillary Latest Ref Range: 65 - 99 mg/dL 409225 (H) 811220 (H) 914389 (H) 391 (H)    Review of Glycemic Control  Diabetes history: DM2 Outpatient Diabetes medications: Glipizide 10 mg BID, Lantus 28 units QHS, Novolog 18 units TID with meals for meal coverage Current orders for Inpatient glycemic control: Lantus 28 units QHS, Novolog 0-15 units TID with meals, Novolog 0-5 units QHS, Glipizide 10 mg BID  Inpatient Diabetes Program Recommendations:  Insulin - Basal: Please consider increasing Lantus to 35 units QHS (based on 114 kg x 0.3 units). Insulin - Meal Coverage: While inpatient and ordered steroids,please consider ordering Novolog 5 units TID with meals for meal coverage.  Thanks, Orlando PennerMarie Breiana Stratmann, RN, MSN, CDE Diabetes Coordinator Inpatient Diabetes Program [phone] (Team Pager from 8am to 5pm) [phone] ([facility] office) [phone] [facility] office) [phone] [facility] office)

## 2016-08-27 NOTE — Discharge Summary (Signed)
Discharge summary [facility] [place] [facility]   PATIENT NAME: Emily Osborn    MR#:  [id number]  DATE OF BIRTH:  March 20, 1941  DATE OF ADMISSION:  08/26/2016 ADMITTING PHYSICIAN: Alford Highland, MD  DATE OF DISCHARGE: 08/27/2016 11:16 AM  PRIMARY CARE PHYSICIAN: Dr Joen Laura   ADMISSION DIAGNOSIS:  CVA (cerebral vascular accident) [facility]) [I63.9] Right knee pain [M25.561] Cerebrovascular accident (CVA), unspecified mechanism (HCC) [I63.9]  DISCHARGE DIAGNOSIS:  Active Problems:   Right knee pain   SECONDARY DIAGNOSIS:   Past Medical History:  Diagnosis Date  . Arthritis   . CHF (congestive heart failure) (HCC)   . Chronic kidney disease    RENAL INSUFF  . Coronary artery disease   . Depression   . Diabetes mellitus without complication (HCC)   . Edema    LEGS/FEET  . Hypertension   . Panic attacks   . Shortness of breath dyspnea    WITH HEAT OR EXERTION  . Sleep apnea    NO CPAP  . Sleeps in sitting position due to orthopnea   . Stroke (HCC)    X 2    2000/ 2001   TIA 1990S    HOSPITAL COURSE:   1. Right knee pain. I did give the patient an oral steroid and asked orthopedic surgery to see the patient. They injected the right knee with steroid and the patient's pain and mobility has improved. Patient wanted to go home. 2. Abnormal CT scan of the head. Patchy hypodensities within the left basal cannula favored to represent subacute to chronic infarcts. The patient states that she did not have a stroke and refused MRI. She states she will not take aspirin or Plavix secondary to GI bleed. Carotid ultrasound which was done showed moderate 50-69% stenosis left internal carotid artery. Patient refused any further workup. Patient already on high-dose statin. Echocardiogram refused. 3. 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 I did give gentle IV fluid hydration yesterday 1 L. Can go back on her usual medications as outpatient. Continue  sodium bicarbonate. 4. Hyperlipidemia unspecified on pravastatin 5. Essential hypertension continue usual medications 6. Depression continue Paxil  Patient very upset about the workup ordered to rule out stroke. Patient very upset about her medications not being given exactly has he takes it at home. Patient discharged to home.   DISCHARGE CONDITIONS:   Fair  CONSULTS OBTAINED:  Treatment Team:  Christena Flake, MD  DRUG ALLERGIES:  No Known Allergies  DISCHARGE MEDICATIONS:   Discharge Medication List as of 08/27/2016  8:53 AM    CONTINUE these medications which have NOT CHANGED   Details  acetaminophen (TYLENOL) 500 MG tablet Take 500 mg by mouth every 6 (six) hours as needed for mild pain., Until Discontinued, Historical Med    amLODipine (NORVASC) 10 MG tablet Take 10 mg by mouth daily., Until Discontinued, Historical Med    docusate sodium (COLACE) 100 MG capsule Take 100 mg by mouth daily as needed for mild constipation., Historical Med    furosemide (LASIX) 40 MG tablet Take 40 mg by mouth daily. , Historical Med    glipiZIDE (GLUCOTROL) 10 MG tablet Take 1 tablet by mouth 2 (two) times daily., Starting 03/11/2016, Until Discontinued, Historical Med    LANTUS SOLOSTAR 100 UNIT/ML Solostar Pen Take 28 Units by mouth at bedtime., Starting 03/11/2016, Until Discontinued, Historical Med    loratadine (CLARITIN) 10 MG tablet Take 10 mg by mouth daily as needed for allergies., Until  Discontinued, Historical Med    losartan (COZAAR) 100 MG tablet Take 100 mg by mouth daily., Until Discontinued, Historical Med    NOVOLOG FLEXPEN 100 UNIT/ML FlexPen Inject 18 Units into the skin 3 (three) times daily., Starting 03/11/2016, Until Discontinued, Historical Med    PARoxetine (PAXIL-CR) 25 MG 24 hr tablet Take 2 tablets by mouth daily., Starting 03/11/2016, Until Discontinued, Historical Med    pravastatin (PRAVACHOL) 80 MG tablet Take 1 tablet by mouth at bedtime. , Starting Sat  02/28/2016, Historical Med    sodium bicarbonate 650 MG tablet Take 650 mg by mouth 2 (two) times daily., Until Discontinued, Historical Med    oxyCODONE (ROXICODONE) 15 MG immediate release tablet Take 1 tablet by mouth 2 (two) times daily as needed for pain. , Starting 04/05/2016, Until Discontinued, Historical Med      STOP taking these medications     GuaiFENesin (MUCUS RELIEF ADULT PO)          DISCHARGE INSTRUCTIONS:    Follow-up with her medical doctor one week  If you experience worsening of your admission symptoms, develop shortness of breath, life threatening emergency, suicidal or homicidal thoughts you must seek medical attention immediately by calling 911 or calling your MD immediately  if symptoms less severe.  You Must read complete instructions/literature along with all the possible adverse reactions/side effects for all the Medicines you take and that have been prescribed to you. Take any new Medicines after you have completely understood and accept all the possible adverse reactions/side effects.   Please note  You were cared for by a hospitalist during your hospital stay. If you have any questions about your discharge medications or the care you received while you were in the hospital after you are discharged, you can call the unit and asked to speak with the hospitalist on call if the hospitalist that took care of you is not available. Once you are discharged, your primary care physician will handle any further medical issues. Please note that NO REFILLS for any discharge medications will be authorized once you are discharged, as it is imperative that you return to your primary care physician (or establish a relationship with a primary care physician if you do not have one) for your aftercare needs so that they can reassess your need for medications and monitor your lab values.    Today   CHIEF COMPLAINT:   Chief Complaint  Patient presents with  . Weakness     HISTORY OF PRESENT ILLNESS:  Emily Osborn  is a 75 y.o. female with a known history of Chronic kidney disease, diabetes presents with unable to get out of her car and right knee pain   VITAL SIGNS:  Blood pressure (!) 142/68, pulse 87, temperature 98.1 F (36.7 C), temperature source Oral, resp. rate 18, height 5\' 3"  (1.6 m), weight 114.3 kg (252 lb), SpO2 96 %.    PHYSICAL EXAMINATION:  GENERAL:  75 y.o.-year-old patient lying in the bed with no acute distress.  EYES: Pupils equal, round, reactive to light and accommodation. No scleral icterus. Extraocular muscles intact.  HEENT: Head atraumatic, normocephalic. Oropharynx and nasopharynx clear.  NECK:  Supple, no jugular venous distention. No thyroid enlargement, no tenderness.  LUNGS: Normal breath sounds bilaterally, no wheezing, rales,rhonchi or crepitation. No use of accessory muscles of respiration.  CARDIOVASCULAR: S1, S2 normal. No murmurs, rubs, or gallops.  ABDOMEN: Soft, non-tender, non-distended. Bowel sounds present. No organomegaly or mass.  EXTREMITIES: No pedal edema, cyanosis, or clubbing.  NEUROLOGIC: Cranial nerves II through XII are intact. Muscle strength 5/5 in all extremities. Sensation intact. Gait not checked.  PSYCHIATRIC: The patient is alert and oriented x 3.  SKIN: No obvious rash, lesion, or ulcer.   DATA REVIEW:   CBC  Recent Labs Lab 08/27/16 0426  WBC 7.4  HGB 11.9*  HCT 34.8*  PLT 158    Chemistries   Recent Labs Lab 08/26/16 0748 08/27/16 0426  NA 140 136  K 4.2 4.0  CL 110 106  CO2 21* 21*  GLUCOSE 234* 422*  BUN 37* 43*  CREATININE 3.21* 3.20*  CALCIUM 8.8* 8.9  AST 19  --   ALT 15  --   ALKPHOS 80  --   BILITOT 0.5  --     Cardiac Enzymes  Recent Labs Lab 08/26/16 0748  TROPONINI 0.03*     RADIOLOGY:  Ct Head Wo Contrast  Result Date: 08/26/2016 CLINICAL DATA:  Patient with right lower extremity weakness. Alert and oriented. EXAM: CT HEAD WITHOUT CONTRAST  TECHNIQUE: Contiguous axial images were obtained from the base of the skull through the vertex without intravenous contrast. COMPARISON:  None. FINDINGS: Brain: Ventricles and sulci are prominent compatible with atrophy. Periventricular and subcortical white matter hypodensity compatible with chronic microvascular ischemic changes. Patchy hypodensities within the left basal ganglia. No intracranial mass lesion, mass effect or hemorrhage. Vascular: Carotid atherosclerotic calcifications. Skull: Normal. Negative for fracture or focal lesion. Sinuses/Orbits: Paranasal sinuses are well aerated. Mastoid air cells unremarkable. Other: Unremarkable IMPRESSION: Patchy hypodensities within the left basal ganglia favored to represent subacute to chronic infarcts. No acute intracranial hemorrhage. Chronic microvascular ischemic changes and cortical atrophy. Electronically Signed   By: Annia Beltrew  Davis M.D.   On: 08/26/2016 08:44   [place] Carotid Bilateral  Result Date: 08/26/2016 CLINICAL DATA:  75 year old female with acute cerebral vascular accident EXAM: BILATERAL CAROTID DUPLEX ULTRASOUND TECHNIQUE: Wallace CullensGray scale imaging, color Doppler and duplex ultrasound were performed of bilateral carotid and vertebral arteries in the neck. COMPARISON:  CT scan of the head 08/26/2016; prior duplex carotid ultrasound 01/08/2002 FINDINGS: Criteria: Quantification of carotid stenosis is based on velocity parameters that correlate the residual internal carotid diameter with NASCET-based stenosis levels, using the diameter of the distal internal carotid lumen as the denominator for stenosis measurement. The following velocity measurements were obtained: RIGHT ICA:  118/31 cm/sec CCA:  100/10 cm/sec SYSTOLIC ICA/CCA RATIO:  1.2 DIASTOLIC ICA/CCA RATIO:  3.2 ECA:  116 cm/sec LEFT ICA:  135/40 cm/sec CCA:  75/12 cm/sec SYSTOLIC ICA/CCA RATIO:  1.8 DIASTOLIC ICA/CCA RATIO:  3.4 ECA:  100 cm/sec RIGHT CAROTID ARTERY: Mild heterogeneous atherosclerotic  plaque in the proximal internal carotid artery. By peak systolic velocity criteria the estimated stenosis remains less than 50%. The artery is tortuous distally. RIGHT VERTEBRAL ARTERY:  Patent with normal antegrade flow. LEFT CAROTID ARTERY: Heterogeneous and partially calcified atherosclerotic plaque in the proximal internal carotid artery. By peak systolic velocity criteria the estimated stenosis falls in the 50- 69% diameter range. There is associated spectral broadening. LEFT VERTEBRAL ARTERY:  Patent with normal antegrade flow. IMPRESSION: 1. Mild (1-49%) stenosis proximal right internal carotid artery secondary to heterogenous atherosclerotic plaque. 2. Moderate (50-69%) stenosis proximal left internal carotid artery secondary to heterogenous and partially calcified atherosclerotic plaque. 3. Vertebral arteries are patent with normal antegrade flow. Signed, Sterling BigHeath K. McCullough, MD [facility] Electronically Signed   By: Malachy MoanHeath  McCullough M.D.   On: 08/26/2016 11:54   Dg Knee  Complete 4 Views Right  Result Date: 08/26/2016 CLINICAL DATA:  PT C/O PAIN FOR 5 DAYS NOW. RIGHT KNEE PAIN. NO INJURY. MOST OF HER PAIN IS MEDIAL. PT HAS HAD PAIN PREVIOUSLY WITH THAT KNEE. EXAM: RIGHT KNEE - COMPLETE 4+ VIEW COMPARISON:  None. FINDINGS: Bones appear radiolucent. There is acute fracture or subluxation. No joint effusion. Joint space narrowing identified in the patellofemoral and medial compartments. Atherosclerotic calcification of the popliteal artery. IMPRESSION: No evidence for acute  abnormality.  Degenerative changes. Electronically Signed   By: Norva Pavlov M.D.   On: 08/26/2016 10:35    Management plans discussed with the patient, and she is in agreement.  CODE STATUS:  Code Status History    Date Active Date Inactive Code Status Order ID Comments User Context   08/26/2016  9:59 AM 08/27/2016  2:16 PM DNR [id number]  Alford Highland, MD ED     Questions for Most Recent Historical Code Status (Order [id number])    Question Answer Comment   In the event of cardiac or respiratory ARREST Do not call a "code blue"    In the event of cardiac or respiratory ARREST Do not perform Intubation, CPR, defibrillation or ACLS    In the event of cardiac or respiratory ARREST Use medication by any route, position, wound care, and other measures to relive pain and suffering. May use oxygen, suction and manual treatment of airway obstruction as needed for comfort.    Comments nurse may pronounce         Advance Directive Documentation   Flowsheet Row Most Recent Value  Type of Advance Directive  Living will, Healthcare Power of Attorney, Out of facility DNR (pink MOST or yellow form)  Pre-existing out of facility DNR order (yellow form or pink MOST form)  No data  "MOST" Form in Place?  No data      TOTAL TIME TAKING CARE OF THIS PATIENT: 35 minutes.    Alford Highland M.D on 08/27/2016 at 4:08 PM  Between 7am to 6pm - Pager - [phone]  After 6pm go to [email or web address] - password [email or web address]  [facility] Office  [phone]  CC: Primary care physician; Dr Joen Laura

## 2016-08-27 NOTE — Progress Notes (Signed)
PT Cancellation Note  Patient Details Name: Emily Osborn MRN: [id number] DOB: 07/21/1941   Cancelled Treatment:    Reason Eval/Treat Not Completed: Other (comment) (Per RN, pt refusing further tests and was d/c).  Attempted to see pt to complete PT evaluation; however, pt had already been d/c.   Encarnacion ChuAshley Abashian PT, DPT 08/27/2016, 11:11 AM

## 2016-09-15 ENCOUNTER — Other Ambulatory Visit: Payer: Self-pay | Admitting: Surgery

## 2016-09-15 DIAGNOSIS — M1711 Unilateral primary osteoarthritis, right knee: Secondary | ICD-10-CM

## 2016-09-15 DIAGNOSIS — M23203 Derangement of unspecified medial meniscus due to old tear or injury, right knee: Secondary | ICD-10-CM

## 2016-09-24 ENCOUNTER — Ambulatory Visit
Admission: RE | Admit: 2016-09-24 | Discharge: 2016-09-24 | Disposition: A | Discharge status: Home / Self Care | Payer: Medicare HMO | Source: Ambulatory Visit | Attending: Surgery | Admitting: Surgery

## 2016-09-24 DIAGNOSIS — S8254XA Nondisplaced fracture of medial malleolus of right tibia, initial encounter for closed fracture: Secondary | ICD-10-CM | POA: Diagnosis not present

## 2016-09-24 DIAGNOSIS — M23203 Derangement of unspecified medial meniscus due to old tear or injury, right knee: Secondary | ICD-10-CM

## 2016-09-24 DIAGNOSIS — M949 Disorder of cartilage, unspecified: Secondary | ICD-10-CM | POA: Insufficient documentation

## 2016-09-24 DIAGNOSIS — M7121 Synovial cyst of popliteal space [Baker], right knee: Secondary | ICD-10-CM | POA: Insufficient documentation

## 2016-09-24 DIAGNOSIS — M25461 Effusion, right knee: Secondary | ICD-10-CM | POA: Insufficient documentation

## 2016-09-24 DIAGNOSIS — M1711 Unilateral primary osteoarthritis, right knee: Secondary | ICD-10-CM | POA: Diagnosis present

## 2016-09-24 DIAGNOSIS — X58XXXA Exposure to other specified factors, initial encounter: Secondary | ICD-10-CM | POA: Insufficient documentation

## 2016-11-30 ENCOUNTER — Encounter (INDEPENDENT_AMBULATORY_CARE_PROVIDER_SITE_OTHER): Payer: Self-pay | Admitting: Vascular Surgery

## 2016-11-30 ENCOUNTER — Other Ambulatory Visit (INDEPENDENT_AMBULATORY_CARE_PROVIDER_SITE_OTHER): Payer: Medicare HMO

## 2016-11-30 ENCOUNTER — Other Ambulatory Visit (INDEPENDENT_AMBULATORY_CARE_PROVIDER_SITE_OTHER): Payer: Self-pay | Admitting: Vascular Surgery

## 2016-11-30 ENCOUNTER — Encounter (INDEPENDENT_AMBULATORY_CARE_PROVIDER_SITE_OTHER): Payer: Self-pay

## 2016-11-30 ENCOUNTER — Ambulatory Visit (INDEPENDENT_AMBULATORY_CARE_PROVIDER_SITE_OTHER): Payer: Medicare HMO | Admitting: Vascular Surgery

## 2016-11-30 VITALS — BP 152/74 | HR 80 | Resp 16 | Ht 63.0 in | Wt 246.0 lb

## 2016-11-30 DIAGNOSIS — Z794 Long term (current) use of insulin: Secondary | ICD-10-CM

## 2016-11-30 DIAGNOSIS — N184 Chronic kidney disease, stage 4 (severe): Secondary | ICD-10-CM | POA: Diagnosis not present

## 2016-11-30 DIAGNOSIS — E1122 Type 2 diabetes mellitus with diabetic chronic kidney disease: Secondary | ICD-10-CM | POA: Diagnosis not present

## 2016-11-30 DIAGNOSIS — N185 Chronic kidney disease, stage 5: Secondary | ICD-10-CM

## 2016-11-30 DIAGNOSIS — E119 Type 2 diabetes mellitus without complications: Secondary | ICD-10-CM | POA: Insufficient documentation

## 2016-11-30 DIAGNOSIS — Z01810 Encounter for preprocedural cardiovascular examination: Secondary | ICD-10-CM | POA: Diagnosis not present

## 2016-11-30 DIAGNOSIS — I10 Essential (primary) hypertension: Secondary | ICD-10-CM | POA: Diagnosis not present

## 2016-11-30 DIAGNOSIS — E785 Hyperlipidemia, unspecified: Secondary | ICD-10-CM | POA: Diagnosis not present

## 2016-11-30 NOTE — Assessment & Plan Note (Signed)
lipid control important in reducing the progression of atherosclerotic disease. Continue statin therapy  

## 2016-11-30 NOTE — Assessment & Plan Note (Signed)
The last GFR that I see from the hospital system was 13 which would be consistent with stage V chronic kidney disease. She is clearly nearing dialysis this is likely from a combination of diabetes and hypertension. Although she does not have an urgent need for dialysis, she certainly has renal function is poor enough to consider placement of AV fistula for permanent dialysis access. Vein mapping today demonstrates what appears to be adequate cephalic vein in the forearm bilaterally with small but triphasic radial and ulnar arteries bilaterally as well. I would recommend a left radiocephalic AV fistula be created. I have discussed the risks and benefits of the procedure including non-maturation, steal syndrome, injury to nearby structures, and need for adjuvant therapies. I discussed placing this well in advance would likely avoid catheter dependence. At this point, she says she does not want to have this done and think she does not want to do dialysis. She is scheduled to see her nephrologist next week and I have urged her to discuss that with him and await to hear a call from her to get her on the schedule for a left radiocephalic AV fistula.

## 2016-11-30 NOTE — Assessment & Plan Note (Signed)
blood glucose control important in reducing the progression of atherosclerotic disease. Also, involved in wound healing and an underlying cause of her renal insufficiency. On appropriate medications.

## 2016-11-30 NOTE — Progress Notes (Signed)
Patient ID: Emily LyMartha Wren Dahle, female   DOB: 07/26/1941, 76 y.o.   MRN: [id number]  Chief Complaint  Patient presents with  . New Evaluation    Vein Mapping    HPI Emily Osborn is a 76 y.o. female.  I am asked to see the patient by Dr. Thedore MinsSingh for evaluation of permanent dialysis access.  The patient reports having seen Dr. Thedore MinsSingh for several years and having progression of her Chronic kidney disease slowly over time. She has now reached severe chronic kidney disease and is referred for consideration for AV fistula placement. She reports chronic swelling in her legs and shortness of breath with exertion. He does not have any poor appetite, severe itching, or other symptoms of severe uremia requiring dialysis currently. She is right-hand dominant. She walks with a walker and has a low energy level, but does not have any new specific complaints today. She denies fever or chills. Vein mapping today demonstrates what appears to be adequate cephalic vein in the forearm bilaterally with small but triphasic radial and ulnar arteries bilaterally as well.   Past Medical History:  Diagnosis Date  . Arthritis   . CHF (congestive heart failure) (HCC)   . Chronic kidney disease    RENAL INSUFF  . Coronary artery disease   . Depression   . Diabetes mellitus without complication (HCC)   . Edema    LEGS/FEET  . Hypertension   . Panic attacks   . Shortness of breath dyspnea    WITH HEAT OR EXERTION  . Sleep apnea    NO CPAP  . Sleeps in sitting position due to orthopnea   . Stroke (HCC)    X 2    2000/ 2001   TIA 1990S    Past Surgical History:  Procedure Laterality Date  . CATARACT EXTRACTION W/PHACO Right 06/15/2016   Procedure: CATARACT EXTRACTION PHACO AND INTRAOCULAR LENS PLACEMENT (IOC);  Surgeon: Galen ManilaWilliam Porfilio, MD;  Location: [facility] ORS;  Service: Ophthalmology;  Laterality: Right;  US 1.53 AP% 21.4 CDE 24.41 Fluid bag lot # [id number]  . CORONARY ANGIOPLASTY     STENTS 2001  .  TONSILLECTOMY      Family History  Problem Relation Age of Onset  . Diabetes Mother   . Alzheimer's disease Mother   . Diabetes Father   . CAD Father   No bleeding or clotting disorders  Social History Social History  Substance Use Topics  . Smoking status: Former Games developermoker  . Smokeless tobacco: Never Used  . Alcohol use No  No IVDU  No Known Allergies  Current Outpatient Prescriptions  Medication Sig Dispense Refill  . acetaminophen (TYLENOL) 500 MG tablet Take 500 mg by mouth every 6 (six) hours as needed for mild pain.    Marland Kitchen. amLODipine (NORVASC) 10 MG tablet Take 10 mg by mouth daily.    Marland Kitchen. docusate sodium (COLACE) 100 MG capsule Take 100 mg by mouth daily as needed for mild constipation.    . furosemide (LASIX) 40 MG tablet Take 40 mg by mouth daily.     Marland Kitchen. glipiZIDE (GLUCOTROL) 10 MG tablet Take 1 tablet by mouth 2 (two) times daily.    . irbesartan (AVAPRO) 300 MG tablet     . LANTUS SOLOSTAR 100 UNIT/ML Solostar Pen Take 28 Units by mouth at bedtime.    Marland Kitchen. loratadine (CLARITIN) 10 MG tablet Take 10 mg by mouth daily as needed for allergies.    Marland Kitchen. losartan (COZAAR) 100 MG tablet Take  100 mg by mouth daily.    Marland Kitchen NOVOLOG FLEXPEN 100 UNIT/ML FlexPen Inject 18 Units into the skin 3 (three) times daily.    Marland Kitchen oxyCODONE (ROXICODONE) 15 MG immediate release tablet Take 1 tablet by mouth 2 (two) times daily as needed for pain.     Marland Kitchen PARoxetine (PAXIL-CR) 25 MG 24 hr tablet Take 2 tablets by mouth daily.    . pravastatin (PRAVACHOL) 80 MG tablet Take 1 tablet by mouth at bedtime.     . sodium bicarbonate 650 MG tablet Take 650 mg by mouth 2 (two) times daily.    . traMADol (ULTRAM) 50 MG tablet Take by mouth.     No current facility-administered medications for this visit.       REVIEW OF SYSTEMS (Negative unless checked)  Constitutional: [] Weight loss  [] Fever  [] Chills Cardiac: [] Chest pain   [] Chest pressure   [] Palpitations   [] Shortness of breath when laying flat   [] Shortness  of breath at rest   [] Shortness of breath with exertion. Vascular:  [] Pain in legs with walking   [] Pain in legs at rest   [] Pain in legs when laying flat   [] Claudication   [] Pain in feet when walking  [] Pain in feet at rest  [] Pain in feet when laying flat   [] History of DVT   [] Phlebitis   [x] Swelling in legs   [] Varicose veins   [] Non-healing ulcers Pulmonary:   [] Uses home oxygen   [] Productive cough   [] Hemoptysis   [] Wheeze  [] COPD   [] Asthma Neurologic:  [] Dizziness  [] Blackouts   [] Seizures   [] History of stroke   [] History of TIA  [] Aphasia   [] Temporary blindness   [] Dysphagia   [] Weakness or numbness in arms   [] Weakness or numbness in legs Musculoskeletal:  [x] Arthritis   [] Joint swelling   [x] Joint pain   [] Low back pain Hematologic:  [] Easy bruising  [] Easy bleeding   [] Hypercoagulable state   [] Anemic  [] Hepatitis Gastrointestinal:  [] Blood in stool   [] Vomiting blood  [] Gastroesophageal reflux/heartburn   [] Abdominal pain Genitourinary:  [x] Chronic kidney disease   [] Difficult urination  [] Frequent urination  [] Burning with urination   [] Hematuria Skin:  [] Rashes   [] Ulcers   [] Wounds Psychological:  [] History of anxiety   []  History of major depression.    Physical Exam BP (!) 152/74 (BP Location: Right Arm)   Pulse 80   Resp 16   Ht 5\' 3"  (1.6 m)   Wt 246 lb (111.6 kg)   BMI 43.58 kg/m  Gen:  WD/WN, NAD Head: [place]/AT, No temporalis wasting. Prominent temp pulse not noted. Ear/Nose/Throat: Hearing grossly intact, nares w/o erythema or drainage, oropharynx w/o Erythema/Exudate Eyes: Conjunctiva clear, sclera non-icteric  Neck: trachea midline.  No JVD.  Pulmonary:  Good air movement, respirations not labored, no use of accessory muscles Cardiac: RRR, normal S1, S2 Vascular: Allen's test normal bilaterally Vessel Right Left  Radial Palpable Palpable                                   Gastrointestinal: soft, non-tender/non-distended. No guarding/reflex. No masses,  surgical incisions, or scars. Musculoskeletal: M/S 5/5 throughout.  Extremities without ischemic changes.  Walks with a walker. 2+ LE edema. Neurologic: Sensation grossly intact in extremities.  Symmetrical.  Speech is fluent. Motor exam as listed above. Psychiatric: Judgment intact, Mood & affect appropriate for pt's clinical situation. Dermatologic: No rashes or ulcers noted.  No cellulitis or open wounds. Lymph : No Cervical, Axillary, or Inguinal lymphadenopathy.   Radiology No results found.  Labs No results found for this or any previous visit (from the past 2160 hour(s)).  Assessment/Plan:  Diabetes (HCC) blood glucose control important in reducing the progression of atherosclerotic disease. Also, involved in wound healing and an underlying cause of her renal insufficiency. On appropriate medications.   Essential hypertension, benign blood pressure control important in reducing the progression of atherosclerotic disease. On appropriate oral medications. This is also an underlying cause of her renal insufficiency   Hyperlipidemia lipid control important in reducing the progression of atherosclerotic disease. Continue statin therapy   Chronic kidney disease, stage V (HCC) The last GFR that I see from the hospital system was 13 which would be consistent with stage V chronic kidney disease. She is clearly nearing dialysis this is likely from a combination of diabetes and hypertension. Although she does not have an urgent need for dialysis, she certainly has renal function is poor enough to consider placement of AV fistula for permanent dialysis access. Vein mapping today demonstrates what appears to be adequate cephalic vein in the forearm bilaterally with small but triphasic radial and ulnar arteries bilaterally as well. I would recommend a left radiocephalic AV fistula be created. I have discussed the risks and benefits of the procedure including non-maturation, steal syndrome,  injury to nearby structures, and need for adjuvant therapies. I discussed placing this well in advance would likely avoid catheter dependence. At this point, she says she does not want to have this done and think she does not want to do dialysis. She is scheduled to see her nephrologist next week and I have urged her to discuss that with him and await to hear a call from her to get her on the schedule for a left radiocephalic AV fistula.      Festus Barren 11/30/2016, 3:37 PM   This note was created with Dragon medical transcription system.  Any errors from dictation are unintentional.

## 2016-11-30 NOTE — Patient Instructions (Signed)
AV Fistula Placement, Care After Refer to this sheet in the next few weeks. These instructions provide you with information about caring for yourself after your procedure. Your health care provider may also give you more specific instructions. Your treatment has been planned according to current medical practices, but problems sometimes occur. Call your health care provider if you have any problems or questions after your procedure. What can I expect after the procedure? After your procedure, it is common to:  Feel sore.  Have numbness.  Feel cold.  Feel a vibration (thrill) over the fistula. Follow these instructions at home:   Incision care   Do not take baths or showers, swim, or use a hot tub until your health care provider approves.  Keep the area around your cut from surgery (incision) clean and dry.  Follow instructions from your health care provider about how to take care of your incision. Make sure you:  Wash your hands with soap and water before you change your bandage (dressing). If soap and water are not available, use hand sanitizer.  Change your dressing as told by your health care provider.  Leave stitches (sutures) and clips in place. They may need to stay in place for 2 weeks or longer. Fistula Care   Check your fistula site every day to make sure the thrill feels the same.  Check your fistula site every day for signs of infection. Watch for:  Redness.  Swelling.  Discharge.  Tenderness.  Enlargement.  Keep your arm raised (elevated) while you rest.  Do not lift anything that is heavier than a gallon of milk with the arm that has the fistula.  Do not lie down on your fistula arm.  Do not let anyone draw blood or take a blood pressure reading on your fistula arm.  Do not wear tight jewelry or clothing over your fistula arm. General instructions   Rest at home for a day or two.  Gradually start doing your usual activities again. Ask your surgeon  when you can return to work or school.  Take over-the-counter and prescription medicines only as told by your health care provider.  Keep all follow-up visits as told by your health care provider. This is important. Contact a health care provider if:  You have chills or a fever.  You have pain at your fistula site that is not going away.  You have numbness or coldness at your fistula site that is not going away.  You feel a decrease or a change in the thrill.  You have swelling in your arm or hand.  You have redness, swelling, discharge, tenderness, or enlargement at your fistula site. Get help right away if:  You are bleeding from your fistula site.  You have chest pain.  You have trouble breathing. This information is not intended to replace advice given to you by your health care provider. Make sure you discuss any questions you have with your health care provider. Document Released: 11/08/2005 Document Revised: 03/17/2016 Document Reviewed: 01/29/2015 Elsevier Interactive Patient Education  2017 Elsevier Inc.  

## 2016-11-30 NOTE — Assessment & Plan Note (Signed)
blood pressure control important in reducing the progression of atherosclerotic disease. On appropriate oral medications. This is also an underlying cause of her renal insufficiency

## 2017-02-01 ENCOUNTER — Observation Stay
Admission: EM | Admit: 2017-02-01 | Discharge: 2017-02-02 | Disposition: A | Discharge status: Home / Self Care | Payer: Medicare HMO | Attending: Specialist | Admitting: Specialist

## 2017-02-01 ENCOUNTER — Encounter: Payer: Self-pay | Admitting: Emergency Medicine

## 2017-02-01 DIAGNOSIS — I2510 Atherosclerotic heart disease of native coronary artery without angina pectoris: Secondary | ICD-10-CM | POA: Insufficient documentation

## 2017-02-01 DIAGNOSIS — W19XXXA Unspecified fall, initial encounter: Secondary | ICD-10-CM | POA: Insufficient documentation

## 2017-02-01 DIAGNOSIS — E11649 Type 2 diabetes mellitus with hypoglycemia without coma: Secondary | ICD-10-CM | POA: Diagnosis not present

## 2017-02-01 DIAGNOSIS — Z8673 Personal history of transient ischemic attack (TIA), and cerebral infarction without residual deficits: Secondary | ICD-10-CM | POA: Insufficient documentation

## 2017-02-01 DIAGNOSIS — F410 Panic disorder [episodic paroxysmal anxiety] without agoraphobia: Secondary | ICD-10-CM | POA: Insufficient documentation

## 2017-02-01 DIAGNOSIS — E1122 Type 2 diabetes mellitus with diabetic chronic kidney disease: Secondary | ICD-10-CM | POA: Diagnosis not present

## 2017-02-01 DIAGNOSIS — Z794 Long term (current) use of insulin: Secondary | ICD-10-CM | POA: Insufficient documentation

## 2017-02-01 DIAGNOSIS — I10 Essential (primary) hypertension: Secondary | ICD-10-CM | POA: Diagnosis present

## 2017-02-01 DIAGNOSIS — I129 Hypertensive chronic kidney disease with stage 1 through stage 4 chronic kidney disease, or unspecified chronic kidney disease: Secondary | ICD-10-CM | POA: Diagnosis not present

## 2017-02-01 DIAGNOSIS — N19 Unspecified kidney failure: Secondary | ICD-10-CM

## 2017-02-01 DIAGNOSIS — E119 Type 2 diabetes mellitus without complications: Secondary | ICD-10-CM

## 2017-02-01 DIAGNOSIS — N189 Chronic kidney disease, unspecified: Secondary | ICD-10-CM | POA: Diagnosis present

## 2017-02-01 DIAGNOSIS — F419 Anxiety disorder, unspecified: Secondary | ICD-10-CM | POA: Insufficient documentation

## 2017-02-01 DIAGNOSIS — G4733 Obstructive sleep apnea (adult) (pediatric): Secondary | ICD-10-CM | POA: Insufficient documentation

## 2017-02-01 DIAGNOSIS — E162 Hypoglycemia, unspecified: Secondary | ICD-10-CM

## 2017-02-01 DIAGNOSIS — N184 Chronic kidney disease, stage 4 (severe): Secondary | ICD-10-CM | POA: Insufficient documentation

## 2017-02-01 HISTORY — DX: Disorder of kidney and ureter, unspecified: N28.9

## 2017-02-01 LAB — BASIC METABOLIC PANEL
Anion gap: 7 (ref 5–15)
BUN: 42 mg/dL — ABNORMAL HIGH (ref 6–20)
CHLORIDE: 109 mmol/L (ref 101–111)
CO2: 21 mmol/L — ABNORMAL LOW (ref 22–32)
CREATININE: 3.47 mg/dL — AB (ref 0.44–1.00)
Calcium: 8.1 mg/dL — ABNORMAL LOW (ref 8.9–10.3)
GFR calc Af Amer: 14 mL/min — ABNORMAL LOW (ref >60)
GFR, EST NON AFRICAN AMERICAN: 12 mL/min — AB (ref >60)
Glucose, Bld: 111 mg/dL — ABNORMAL HIGH (ref 65–99)
POTASSIUM: 4 mmol/L (ref 3.5–5.1)
SODIUM: 137 mmol/L (ref 135–145)

## 2017-02-01 LAB — CBC WITH DIFFERENTIAL/PLATELET
BASOS PCT: 1 %
Basophils Absolute: 0.1 10*3/uL (ref 0–0.1)
EOS ABS: 0.2 10*3/uL (ref 0–0.7)
Eosinophils Relative: 2 %
HCT: 36 % (ref 35.0–47.0)
HEMOGLOBIN: 12.2 g/dL (ref 12.0–16.0)
Lymphocytes Relative: 12 %
Lymphs Abs: 1.4 10*3/uL (ref 1.0–3.6)
MCH: 29.4 pg (ref 26.0–34.0)
MCHC: 33.9 g/dL (ref 32.0–36.0)
MCV: 86.6 fL (ref 80.0–100.0)
Monocytes Absolute: 0.9 10*3/uL (ref 0.2–0.9)
Monocytes Relative: 8 %
NEUTROS PCT: 77 %
Neutro Abs: 8.8 10*3/uL — ABNORMAL HIGH (ref 1.4–6.5)
PLATELETS: 191 10*3/uL (ref 150–440)
RBC: 4.16 MIL/uL (ref 3.80–5.20)
RDW: 14.8 % — ABNORMAL HIGH (ref 11.5–14.5)
WBC: 11.3 10*3/uL — AB (ref 3.6–11.0)

## 2017-02-01 LAB — GLUCOSE, CAPILLARY
GLUCOSE-CAPILLARY: 79 mg/dL (ref 65–99)
GLUCOSE-CAPILLARY: 115 mg/dL — AB (ref 65–99)

## 2017-02-01 MED ORDER — OCTREOTIDE ACETATE 100 MCG/ML IJ SOLN
100.0000 ug | Freq: Once | INTRAMUSCULAR | Status: DC
  Filled 2017-02-01: qty 1

## 2017-02-01 MED ORDER — DEXTROSE-NACL 5-0.45 % IV SOLN
INTRAVENOUS | Status: DC
  Administered 2017-02-01: 23:00:00 via INTRAVENOUS

## 2017-02-01 MED ORDER — DEXTROSE 5 % IV SOLN
Freq: Once | INTRAVENOUS | Status: AC
  Administered 2017-02-01: 23:00:00 via INTRAVENOUS
  Filled 2017-02-01: qty 50

## 2017-02-01 MED ORDER — DEXTROSE 5 % IV SOLN: INTRAVENOUS | Status: DC

## 2017-02-01 NOTE — ED Notes (Signed)
Pt states she does not want to me admitted and wants to go home regardless of medical risk of going home. MD at bedside explaining medical need to stay home and dangers of going home.

## 2017-02-01 NOTE — ED Notes (Signed)
Pt's O2 saturation dropped to 87%. Pt placed on 2L via nasal canula

## 2017-02-01 NOTE — ED Notes (Signed)
Per MD order pt took her own blood pressure medication. Pt's blood pressure pre medication 194/95.

## 2017-02-01 NOTE — ED Notes (Signed)
Pt assisted to the bathroom with 2 assist. Pt given orange juice.

## 2017-02-01 NOTE — ED Provider Notes (Signed)
[facility] Emergency Department Provider Note  ____________________________________________   First MD Initiated Contact with Patient 02/01/17 2112     (approximate)  I have reviewed the triage vital signs and the nursing notes.   HISTORY  Chief Complaint Hypoglycemia and Fall   HPI Emily Osborn is a 76 y.o. female with a history of diabetes and kidney failure on glipizide as well as insulin and was presenting for hypoglycemic episode. The patient says that she was walking in her home and the next thing she knew about the lower around her covering over her and her kitchen. Per EMS, the patient had a glucose in the 40s when they arrived. She was given dextrose with resolution of her mentation. The patient denies any recent changes in her medications but says that she may have taken too much of her insulin or taken the dose is too close together this evening. She is denying any pain at this time. Denies feeling ill lately.   Past Medical History:  Diagnosis Date  . Arthritis   . Diabetes mellitus without complication (HCC)   . Hypertension   . Renal disorder   . Stroke [facility])     Patient Active Problem List   Diagnosis Date Noted  . Hypoglycemia 02/01/2017  . Diabetes (HCC) 02/01/2017  . HTN (hypertension) 02/01/2017  . CKD (chronic kidney disease) 02/01/2017    Past Surgical History:  Procedure Laterality Date  . NO PAST SURGERIES      Prior to Admission medications   Not on File    Allergies Patient has no allergy information on record.  Family History  Problem Relation Age of Onset  . Family history unknown: Yes    Social History Social History  Substance Use Topics  . Smoking status: Never Smoker  . Smokeless tobacco: Never Used  . Alcohol use No    Review of Systems Constitutional: No fever/chills Eyes: No visual changes. ENT: No sore throat. Cardiovascular: Denies chest pain. Respiratory: Denies shortness of  breath. Gastrointestinal: No abdominal pain.  No nausea, no vomiting.  No diarrhea.  No constipation. Genitourinary: Negative for dysuria. Musculoskeletal: Negative for back pain. Skin: Negative for rash. Neurological: Negative for headaches, focal weakness or numbness.  10-point ROS otherwise negative.  ____________________________________________   PHYSICAL EXAM:  VITAL SIGNS: ED Triage Vitals  Enc Vitals Group     BP 02/01/17 2103 (!) 176/84     Pulse Rate 02/01/17 2103 85     Resp 02/01/17 2103 20     Temp 02/01/17 2103 98 F (36.7 C)     Temp Source 02/01/17 2103 Oral     SpO2 02/01/17 2103 100 %     Weight 02/01/17 2106 243 lb (110.2 kg)     Height 02/01/17 2106 5\' 3"  (1.6 m)     Head Circumference --      Peak Flow --      Pain Score --      Pain Loc --      Pain Edu? --      Excl. in GC? --     Constitutional: Alert and oriented. Well appearing and in no acute distress. Eyes: Conjunctivae are normal. PERRL. EOMI. Head: Atraumatic. Nose: No congestion/rhinnorhea. Mouth/Throat: Mucous membranes are moist.   Neck: No stridor.   Cardiovascular: Normal rate, regular rhythm. Grossly normal heart sounds.   Respiratory: Normal respiratory effort.  No retractions. Lungs CTAB. Gastrointestinal: Soft and nontender. No distention.  Musculoskeletal: No lower extremity tenderness nor edema.  No joint effusions. Neurologic:  Normal speech and language. No gross focal neurologic deficits are appreciated.  Skin:  Skin is warm, dry and intact. No rash noted. Psychiatric: Mood and affect are normal. Speech and behavior are normal.  ____________________________________________   LABS (all labs ordered are listed, but only abnormal results are displayed)  Labs Reviewed  CBC WITH DIFFERENTIAL/PLATELET - Abnormal; Notable for the following:       Result Value   WBC 11.3 (*)    RDW 14.8 (*)    Neutro Abs 8.8 (*)    All other components within normal limits  BASIC METABOLIC  PANEL - Abnormal; Notable for the following:    CO2 21 (*)    Glucose, Bld 111 (*)    BUN 42 (*)    Creatinine, Ser 3.47 (*)    Calcium 8.1 (*)    GFR calc non Af Amer 12 (*)    GFR calc Af Amer 14 (*)    All other components within normal limits  GLUCOSE, CAPILLARY  GLUCOSE, CAPILLARY  CBG MONITORING, ED  CBG MONITORING, ED  CBG MONITORING, ED  CBG MONITORING, ED   ____________________________________________  EKG   ____________________________________________  RADIOLOGY   ____________________________________________   PROCEDURES  Procedure(s) performed:   Procedures  Critical Care performed:   ____________________________________________   INITIAL IMPRESSION / ASSESSMENT AND PLAN / ED COURSE  Pertinent labs & imaging results that were available during my care of the patient were reviewed by me and considered in my medical decision making (see chart for details).  ----------------------------------------- 11:20 PM on 02/01/2017 -----------------------------------------  After initially having a glucose of 100 and the emergency department and eating applesauce, chips as well as drinking juice, the patient dropped her glucose back down to the 70s. She is also found to be with renal failure. The last kidney function and we have on record from 2007 and was normal. Although the patient says that her kidney function in the past several months has been down we are unclear on her exact numbers. Because of this and her dropping her glucose after a in IV dextrose load as well as by mouth intake she'll be admitted to the hospital. Started on octreotide as well as a dextrose infusion. Signed out to Dr. Anne HahnWillis. The patient as well as the plan and is willing to comply with admission.      ____________________________________________   FINAL CLINICAL IMPRESSION(S) / ED DIAGNOSES  Refractory hypoglycemia. Renal failure.    NEW MEDICATIONS STARTED DURING THIS  VISIT:  New Prescriptions   No medications on file     Note:  This document was prepared using Dragon voice recognition software and may include unintentional dictation errors.    Myrna Blazeravid Matthew Aland Chestnutt, MD 02/01/17 [phone]

## 2017-02-01 NOTE — ED Triage Notes (Signed)
Pt arrived via ems from home after falling. Pt was found by her husband but pt denies any pain or injury. EMS arrived and found a blood glucose reading of 42. EMS administered 1 amp of D50 and had a repeat reading of 148. Upon arrival pt's blood glucose reading 125. Pt alert and oriented. MD gave verbal order to give pt meal to eat. Food and drink provided for pt.

## 2017-02-01 NOTE — H&P (Signed)
[facility] [place] [facility]   PATIENT NAME: Emily Osborn    MR#:  [id number]  DATE OF BIRTH:  February 24, 1941  DATE OF ADMISSION:  02/01/2017  PRIMARY CARE PHYSICIAN: Osborn, Doreene Nest, MD   REQUESTING/REFERRING PHYSICIAN: Pershing Proud, MD  CHIEF COMPLAINT:   Chief Complaint  Patient presents with  . Hypoglycemia  . Fall    HISTORY OF PRESENT ILLNESS:  Emily Osborn  is a 76 y.o. female who presents with Fall due to hypoglycemia. Patient states she was at home today, and as she thinks back on it ate quite a bit less than she normally does, and also thinks that she may have taken her to insulin shots too closely together. Her glucose was in the 40s after she arrived here to the ED, this was down again from an initial value of 150 after she received treatment by EMS. Here she was started on D5 half-normal saline, and hospitalists were called for admission for observation.  PAST MEDICAL HISTORY:   Past Medical History:  Diagnosis Date  . Arthritis   . Diabetes mellitus without complication (HCC)   . Hypertension   . Renal disorder   . Stroke [facility])     PAST SURGICAL HISTORY:   Past Surgical History:  Procedure Laterality Date  . NO PAST SURGERIES      SOCIAL HISTORY:   Social History  Substance Use Topics  . Smoking status: Never Smoker  . Smokeless tobacco: Never Used  . Alcohol use No    FAMILY HISTORY:   Family History  Problem Relation Age of Onset  . Family history unknown: Yes    DRUG ALLERGIES:  Not on File  MEDICATIONS AT HOME:   Prior to Admission medications   Not on File    REVIEW OF SYSTEMS:  Review of Systems  Constitutional: Negative for chills, fever, malaise/fatigue and weight loss.  HENT: Negative for ear pain, hearing loss and tinnitus.   Eyes: Negative for blurred vision, double vision, pain and redness.  Respiratory: Negative for cough, hemoptysis and shortness of breath.   Cardiovascular: Negative for chest  pain, palpitations, orthopnea and leg swelling.  Gastrointestinal: Negative for abdominal pain, constipation, diarrhea, nausea and vomiting.  Genitourinary: Negative for dysuria, frequency and hematuria.  Musculoskeletal: Positive for falls. Negative for back pain, joint pain and neck pain.  Skin:       No acne, rash, or lesions  Neurological: Negative for dizziness, tremors, focal weakness and weakness.  Endo/Heme/Allergies: Negative for polydipsia. Does not bruise/bleed easily.  Psychiatric/Behavioral: Negative for depression. The patient is not nervous/anxious and does not have insomnia.      VITAL SIGNS:   Vitals:   02/01/17 2103 02/01/17 2106  BP: (!) 176/84   Pulse: 85   Resp: 20   Temp: 98 F (36.7 C)   TempSrc: Oral   SpO2: 100%   Weight:  110.2 kg (243 lb)  Height:  5\' 3"  (1.6 m)   Wt Readings from Last 3 Encounters:  02/01/17 110.2 kg (243 lb)    PHYSICAL EXAMINATION:  Physical Exam  Vitals reviewed. Constitutional: She is oriented to person, place, and time. She appears well-developed and well-nourished. No distress.  HENT:  Head: Normocephalic and atraumatic.  Mouth/Throat: Oropharynx is clear and moist.  Eyes: Conjunctivae and EOM are normal. Pupils are equal, round, and reactive to light. No scleral icterus.  Neck: Normal range of motion. Neck supple. No JVD present. No thyromegaly present.  Cardiovascular: Normal rate, regular  rhythm and intact distal pulses.  Exam reveals no gallop and no friction rub.   No murmur heard. Respiratory: Effort normal and breath sounds normal. No respiratory distress. She has no wheezes. She has no rales.  GI: Soft. Bowel sounds are normal. She exhibits no distension. There is no tenderness.  Musculoskeletal: Normal range of motion. She exhibits no edema.  No arthritis, no gout  Lymphadenopathy:    She has no cervical adenopathy.  Neurological: She is alert and oriented to person, place, and time. No cranial nerve deficit.   No dysarthria, no aphasia  Skin: Skin is warm and dry. No rash noted. No erythema.  Psychiatric: She has a normal mood and affect. Her behavior is normal. Judgment and thought content normal.    LABORATORY PANEL:   CBC  Recent Labs Lab 02/01/17 2106  WBC 11.3*  HGB 12.2  HCT 36.0  PLT 191   ------------------------------------------------------------------------------------------------------------------  Chemistries   Recent Labs Lab 02/01/17 2106  NA 137  K 4.0  CL 109  CO2 21*  GLUCOSE 111*  BUN 42*  CREATININE 3.47*  CALCIUM 8.1*   ------------------------------------------------------------------------------------------------------------------  Cardiac Enzymes No results for input(s): TROPONINI in the last 168 hours. ------------------------------------------------------------------------------------------------------------------  RADIOLOGY:  No results found.  EKG:  No orders found for this or any previous visit.  IMPRESSION AND PLAN:  Principal Problem:   Hypoglycemia - likely due to decreased by mouth intake today despite her normal dose of insulin and glipizide. We will keep her on D5 half-normal saline until her glucose stabilizes, which point we will wean him back and turn it off once her levels remain stable. Active Problems:   Diabetes (HCC) - D5 with glucose checks as above. Hold anti-glycemic for now   HTN (hypertension) - continue home meds   CKD (chronic kidney disease) - at baseline, avoid nephrotoxins and monitor  All the records are reviewed and case discussed with ED provider. Management plans discussed with the patient and/or family.  DVT PROPHYLAXIS: SubQ heparin  GI PROPHYLAXIS: None  ADMISSION STATUS: Observation  CODE STATUS: Full Code Status History    This patient does not have a recorded code status. Please follow your organizational policy for patients in this situation.      TOTAL TIME TAKING CARE OF THIS PATIENT: 40  minutes.    Emily Osborn FIELDING 02/01/2017, 11:18 PM  [facility]  Office  [phone]  CC: Primary care physician; Dortha KernBLISS, Emily K, MD

## 2017-02-02 ENCOUNTER — Encounter (INDEPENDENT_AMBULATORY_CARE_PROVIDER_SITE_OTHER): Payer: Self-pay | Admitting: Vascular Surgery

## 2017-02-02 LAB — CBC
HCT: 32 % — ABNORMAL LOW (ref 35.0–47.0)
Hemoglobin: 10.9 g/dL — ABNORMAL LOW (ref 12.0–16.0)
MCH: 29.4 pg (ref 26.0–34.0)
MCHC: 34 g/dL (ref 32.0–36.0)
MCV: 86.4 fL (ref 80.0–100.0)
PLATELETS: 173 10*3/uL (ref 150–440)
RBC: 3.7 MIL/uL — AB (ref 3.80–5.20)
RDW: 14.5 % (ref 11.5–14.5)
WBC: 10.9 10*3/uL (ref 3.6–11.0)

## 2017-02-02 LAB — BASIC METABOLIC PANEL
ANION GAP: 7 (ref 5–15)
BUN: 39 mg/dL — ABNORMAL HIGH (ref 6–20)
CALCIUM: 8 mg/dL — AB (ref 8.9–10.3)
CO2: 18 mmol/L — ABNORMAL LOW (ref 22–32)
CREATININE: 3.21 mg/dL — AB (ref 0.44–1.00)
Chloride: 110 mmol/L (ref 101–111)
GFR calc Af Amer: 15 mL/min — ABNORMAL LOW (ref >60)
GFR, EST NON AFRICAN AMERICAN: 13 mL/min — AB (ref >60)
Glucose, Bld: 292 mg/dL — ABNORMAL HIGH (ref 65–99)
Potassium: 4.5 mmol/L (ref 3.5–5.1)
SODIUM: 135 mmol/L (ref 135–145)

## 2017-02-02 LAB — GLUCOSE, CAPILLARY
GLUCOSE-CAPILLARY: 258 mg/dL — AB (ref 65–99)
GLUCOSE-CAPILLARY: 249 mg/dL — AB (ref 65–99)
Glucose-Capillary: 193 mg/dL — ABNORMAL HIGH (ref 65–99)
Glucose-Capillary: 272 mg/dL — ABNORMAL HIGH (ref 65–99)
Glucose-Capillary: 298 mg/dL — ABNORMAL HIGH (ref 65–99)

## 2017-02-02 MED ORDER — ACETAMINOPHEN 650 MG RE SUPP: 650.0000 mg | Freq: Four times a day (QID) | RECTAL | Status: DC | PRN

## 2017-02-02 MED ORDER — LABETALOL HCL 5 MG/ML IV SOLN
INTRAVENOUS | Status: AC
  Administered 2017-02-02: 10 mg via INTRAVENOUS
  Filled 2017-02-02: qty 4

## 2017-02-02 MED ORDER — PNEUMOCOCCAL VAC POLYVALENT 25 MCG/0.5ML IJ INJ: 0.5000 mL | INJECTION | INTRAMUSCULAR | Status: DC

## 2017-02-02 MED ORDER — LABETALOL HCL 5 MG/ML IV SOLN
10.0000 mg | INTRAVENOUS | Status: DC | PRN
  Administered 2017-02-02: 10 mg via INTRAVENOUS

## 2017-02-02 MED ORDER — ONDANSETRON HCL 4 MG/2ML IJ SOLN: 4.0000 mg | Freq: Four times a day (QID) | INTRAMUSCULAR | Status: DC | PRN

## 2017-02-02 MED ORDER — INSULIN ASPART 100 UNIT/ML FLEXPEN: 15.0000 [IU] | PEN_INJECTOR | Freq: Three times a day (TID) | SUBCUTANEOUS | 11 refills | Status: AC

## 2017-02-02 MED ORDER — HEPARIN SODIUM (PORCINE) 5000 UNIT/ML IJ SOLN: 5000.0000 [IU] | Freq: Three times a day (TID) | INTRAMUSCULAR | Status: DC

## 2017-02-02 MED ORDER — ONDANSETRON HCL 4 MG PO TABS: 4.0000 mg | ORAL_TABLET | Freq: Four times a day (QID) | ORAL | Status: DC | PRN

## 2017-02-02 MED ORDER — ACETAMINOPHEN 325 MG PO TABS: 650.0000 mg | ORAL_TABLET | Freq: Four times a day (QID) | ORAL | Status: DC | PRN

## 2017-02-02 NOTE — ED Notes (Signed)
Pt's O2 saturation maintained above 97%. O2 removed and pt currently on room air.

## 2017-02-02 NOTE — Progress Notes (Signed)
Pt BS elevated, D5 running, MD notified and order to stop[ fluids given. Pt BS still in the 200's this AM.

## 2017-02-02 NOTE — Progress Notes (Signed)
MD ordered patient to be discharged home.  Discharge instructions were reviewed with the patient and she voiced understanding.  Follow-up appointment was made.  No prescriptions given to the patient.  IV was removed with catheter intact.  Patients home medications were given back to her from pharmacy.  All patients questions were answered.  Patient left via wheelchair escorted by auxillary.

## 2017-02-02 NOTE — Care Management Obs Status (Signed)
MEDICARE OBSERVATION STATUS NOTIFICATION   Patient Details  Name: Emily Osborn MRN: [id number] Date of Birth: 05/31/1941   Medicare Observation Status Notification Given:  No (admitted observation less than 24 hours)    Chapman FitchBOWEN, Shemeika Starzyk T, RN 02/02/2017, 11:35 AM

## 2017-02-02 NOTE — Progress Notes (Addendum)
Inpatient Diabetes Program Recommendations  AACE/ADA: New Consensus Statement on Inpatient Glycemic Control (2015)  Target Ranges:  Prepandial:   less than 140 mg/dL      Peak postprandial:   less than 180 mg/dL (1-2 hours)      Critically ill patients:  140 - 180 mg/dL   Results for Emily Osborn, Emily Osborn (MRN [id number]) as of 02/02/2017 10:36  Ref. Range 02/01/2017 22:05 02/01/2017 23:05 02/02/2017 00:09 02/02/2017 02:17 02/02/2017 04:16 02/02/2017 06:33 02/02/2017 07:35  Glucose-Capillary Latest Ref Range: 65 - 99 mg/dL 79 409115 (H) 811193 (H) 914258 (H) 298 (H) 272 (H) 249 (H)   Review of Glycemic Control  Diabetes history: DM2 Outpatient Diabetes medications: Lantus 30 units QHS, Novolog 18 units TID with meals, Glipizide XL 10 mg BID Current orders for Inpatient glycemic control: CBGs ACHS  Inpatient Diabetes Program Recommendations: Outpatient Referral: Recommend patient folllow up with PCP regarding DM control and recommendations for changes with DM medications if needed. Patient needs to monitor glucose 3-4 times per day.  NOTE: Spoke with patient about diabetes and home regimen for diabetes control. Patient reports that she is followed by PCP for diabetes management and currently she takes Lantus 30 units QHS, Novolog 18 units TID with meals, Glipizide XL 10 mg BID as an outpatient for diabetes control. Patient reports that she is taking DM medications as prescribed and that she last seen her PCP last week. Patient also stated that she is followed by Dr. Thedore MinsSingh (Nephrologist) for kidney failure and last seen Dr. Thedore MinsSingh with the last 3 weeks. Inquired about hypoglycemia and patient reports that she does not usually have any issues with hypoglycemia and it has been quite a while since she experienced any hypoglycemia.  Patient reports that she has been eating "bad foods" and so she took her Novolog insulin dosages close together yesterday which she feels is the cause of hypoglycemia.  Patient states that she does  not check her glucose at all and she thinks her glucometer "is in a bag around my chair at home". Recommended patient get a prescription for a new glucometer and patient is adamant that she does not need a new glucometer and reports that she will find the one she already has at home once she gets home today. Discussed danger of taking insulin and not monitoring glucose and stressed importance of glucose monitoring. Reviewed A1C and glucose goals. Discussed hypoglycemia and asked patient to call her PCP right away if she experiences any glucose values less than 70 mg/dl. Patient states that she was already planning to call Dr. [name] (her PCP) today when she gets home to let her know about what happened and to make a follow up appointment. Inquired about prior A1C and patient reports that her last A1C was in the 7% range.   Encouraged patient to check her glucose 3-4 times per day (before meals and at bedtime) and to keep a log book of glucose readings and insulin taken which she will need to take to doctor appointments. Explained that DM medications may need to be adjusted and she reports that she will discuss with her PCP for advice on any needed changes. Patient verbalized understanding of information discussed ands he states that she has no further questions at this time related to diabetes.  Thanks, Orlando PennerMarie Alphonsa Brickle, RN, MSN, CDE Diabetes Coordinator Inpatient Diabetes Program [phone] (Team Pager)

## 2017-02-03 LAB — HEMOGLOBIN A1C
HEMOGLOBIN A1C: 6.6 % — AB (ref 4.8–5.6)
MEAN PLASMA GLUCOSE: 143 mg/dL

## 2017-02-03 NOTE — Discharge Summary (Signed)
[employer] - [place] [facility]   PATIENT NAME: Emily AgeeMartha Osborn    MR#:  [id number]  DATE OF BIRTH:  05/15/1941  DATE OF ADMISSION:  02/01/2017 ADMITTING PHYSICIAN: Emily Manisavid Willis, MD  DATE OF DISCHARGE: 02/02/2017 11:59 AM  PRIMARY CARE PHYSICIAN: Emily Osborn, Emily NestLAURA K, MD    ADMISSION DIAGNOSIS:  Hypoglycemia [E16.2] Renal failure, unspecified chronicity [N19]  DISCHARGE DIAGNOSIS:  Principal Problem:   Hypoglycemia Active Problems:   Diabetes (HCC)   HTN (hypertension)   CKD (chronic kidney disease)   SECONDARY DIAGNOSIS:   Past Medical History:  Diagnosis Date  . Arthritis   . CHF (congestive heart failure) (HCC)   . Chronic kidney disease    RENAL INSUFF  . Coronary artery disease   . Depression   . Diabetes mellitus without complication (HCC)   . Edema    LEGS/FEET  . Hypertension   . Panic attacks   . Renal disorder   . Shortness of breath dyspnea    WITH HEAT OR EXERTION  . Sleep apnea    NO CPAP  . Sleeps in sitting position due to orthopnea   . Stroke (HCC)    X 2    2000/ 2001   TIA 1990S  . Stroke [facility](HCC)     HOSPITAL COURSE:   76 year old female with past medical history of diabetes, history of previous CVA, coronary artery disease, depression, hypertension, panic attacks, obstructive sleep apnea who presented to the hospital after a fall and noted to be hypoglycemic.   1. Hypoglycemia-patient apparently took her insulin to close to each other. Patient took her Lantus and NovoLog close to each other and had poor appetite which led to hypoglycemia. She was observed in the hospital and maintained on a dextrose drip. Her blood sugars have significantly improved now. -I'll obtain a diabetes coordinator consult discussed with the patient about diet and checking her sugars regularly. Patient will resume her insulin and follow-up with her primary care physician Dr. Quillian Osborn within the next few days. I did reduce her NovoLog to 15 units from 18  units.  2. Fall-this was secondary to the hypoglycemia. Patient was ambulatory with the help of nursing staff and did not require any further services.  3. Essential hypertension-patient will resume her Norvasc.  4. Chronic kidney disease stage 4 - pt's Cr. Is close to baseline and will need to followed as outpatient.  - cont. Sodium Bicarb.   5. Anxiety - pt. Will cont. Paxil.   DISCHARGE CONDITIONS:   Stable.   CONSULTS OBTAINED:    DRUG ALLERGIES:  No Known Allergies  DISCHARGE MEDICATIONS:   Allergies as of 02/02/2017   No Known Allergies     Medication List    TAKE these medications   amLODipine 10 MG tablet Commonly known as:  NORVASC Take 10 mg by mouth daily.   docusate sodium 100 MG capsule Commonly known as:  COLACE Take 100 mg by mouth daily as needed for mild constipation.   glipiZIDE 10 MG tablet Commonly known as:  GLUCOTROL Take 10 mg by mouth 2 (two) times daily before a meal.   guaifenesin 400 MG Tabs tablet Commonly known as:  HUMIBID E Take 400 mg by mouth every 4 (four) hours as needed (mucus).   insulin aspart 100 UNIT/ML FlexPen Commonly known as:  NOVOLOG FLEXPEN Inject 15 Units into the skin 3 (three) times daily with meals. What changed:  how much to take   insulin glargine 100 unit/mL Sopn Commonly known  as:  LANTUS Inject 30 Units into the skin at bedtime.   irbesartan 300 MG tablet Commonly known as:  AVAPRO Take 300 mg by mouth daily.   Oxycodone HCl 20 MG Tabs Take 20 mg by mouth 3 (three) times daily as needed (pain).   PARoxetine 25 MG 24 hr tablet Commonly known as:  PAXIL-CR Take 50 mg by mouth daily.   pravastatin 80 MG tablet Commonly known as:  PRAVACHOL Take 80 mg by mouth at bedtime.   sodium bicarbonate 650 MG tablet Take 650 mg by mouth 2 (two) times daily.         DISCHARGE INSTRUCTIONS:   DIET:  Cardiac diet and Diabetic diet  DISCHARGE CONDITION:  Stable  ACTIVITY:  Activity as  tolerated  OXYGEN:  Home Oxygen: No.   Oxygen Delivery: room air  DISCHARGE LOCATION:  home   If you experience worsening of your admission symptoms, develop shortness of breath, life threatening emergency, suicidal or homicidal thoughts you must seek medical attention immediately by calling 911 or calling your MD immediately  if symptoms less severe.  You Must read complete instructions/literature along with all the possible adverse reactions/side effects for all the Medicines you take and that have been prescribed to you. Take any new Medicines after you have completely understood and accpet all the possible adverse reactions/side effects.   Please note  You were cared for by a hospitalist during your hospital stay. If you have any questions about your discharge medications or the care you received while you were in the hospital after you are discharged, you can call the unit and asked to speak with the hospitalist on call if the hospitalist that took care of you is not available. Once you are discharged, your primary care physician will handle any further medical issues. Please note that NO REFILLS for any discharge medications will be authorized once you are discharged, as it is imperative that you return to your primary care physician (or establish a relationship with a primary care physician if you do not have one) for your aftercare needs so that they can reassess your need for medications and monitor your lab values.     Today   BS Stable now.  Pt. Really wants to go home today. No other complaints presently.   VITAL SIGNS:  Blood pressure (!) 180/81, pulse 84, temperature 98.4 F (36.9 C), temperature source Oral, resp. rate (!) 22, height 5\' 3"  (1.6 m), weight 109.8 kg (242 lb 1.6 oz), SpO2 100 %.  I/O:  No intake or output data in the 24 hours ending 02/03/17 1033  PHYSICAL EXAMINATION:  GENERAL:  76 y.o.-year-old obese patient lying in the bed anxious to go home.  EYES:  Pupils equal, round, reactive to light and accommodation. No scleral icterus. Extraocular muscles intact.  HEENT: Head atraumatic, normocephalic. Oropharynx and nasopharynx clear.  NECK:  Supple, no jugular venous distention. No thyroid enlargement, no tenderness.  LUNGS: Normal breath sounds bilaterally, no wheezing, rales,rhonchi. No use of accessory muscles of respiration.  CARDIOVASCULAR: S1, S2 normal. No murmurs, rubs, or gallops.  ABDOMEN: Soft, non-tender, non-distended. Bowel sounds present. No organomegaly or mass.  EXTREMITIES: No pedal edema, cyanosis, or clubbing.  NEUROLOGIC: Cranial nerves II through XII are intact. No focal motor or sensory defecits b/l.  PSYCHIATRIC: The patient is alert and oriented x 3. Good affect.  SKIN: No obvious rash, lesion, or ulcer.   DATA REVIEW:   CBC  Recent Labs Lab 02/02/17 0503  WBC 10.9  HGB 10.9*  HCT 32.0*  PLT 173    Chemistries   Recent Labs Lab 02/02/17 0503  NA 135  Osborn 4.5  CL 110  CO2 18*  GLUCOSE 292*  BUN 39*  CREATININE 3.21*  CALCIUM 8.0*    Cardiac Enzymes No results for input(s): TROPONINI in the last 168 hours.  Microbiology Results  No results found for this or any previous visit.  RADIOLOGY:  No results found.    Management plans discussed with the patient, family and they are in agreement.  CODE STATUS:  Code Status History    Date Active Date Inactive Code Status Order ID Comments User Context   02/02/2017  2:06 AM 02/02/2017  2:59 PM Full Code [id number]  Emily Manis, MD Inpatient   08/26/2016  9:59 AM 08/27/2016  2:16 PM DNR [id number]  Alford Highland, MD ED    Advance Directive Documentation     Most Recent Value  Type of Advance Directive  Healthcare Power of Attorney, Living will  Pre-existing out of facility DNR order (yellow form or pink MOST form)  -  "MOST" Form in Place?  -      TOTAL TIME TAKING CARE OF THIS PATIENT: 40 minutes.    Houston Siren M.D on 02/03/2017 at 10:33  AM  Between 7am to 6pm - Pager - [phone]  After 6pm go to [email or web address] - Social research officer, government  [employer] [facility]  Office  [phone]  CC: Primary care physician; Emily Osborn, Emily Nest, MD

## 2017-03-11 ENCOUNTER — Ambulatory Visit
Admission: EM | Admit: 2017-03-11 | Discharge: 2017-03-11 | Disposition: A | Discharge status: Other Acute Inpatient Hospital | Payer: Medicare HMO | Attending: Family Medicine | Admitting: Family Medicine

## 2017-03-11 ENCOUNTER — Encounter: Payer: Self-pay | Admitting: *Deleted

## 2017-03-11 ENCOUNTER — Emergency Department
Admission: EM | Admit: 2017-03-11 | Discharge: 2017-03-11 | Disposition: A | Discharge status: Home / Self Care | Payer: Medicare HMO | Attending: Emergency Medicine | Admitting: Emergency Medicine

## 2017-03-11 ENCOUNTER — Emergency Department: Payer: Medicare HMO

## 2017-03-11 DIAGNOSIS — I132 Hypertensive heart and chronic kidney disease with heart failure and with stage 5 chronic kidney disease, or end stage renal disease: Secondary | ICD-10-CM | POA: Insufficient documentation

## 2017-03-11 DIAGNOSIS — N185 Chronic kidney disease, stage 5: Secondary | ICD-10-CM | POA: Diagnosis not present

## 2017-03-11 DIAGNOSIS — Z79899 Other long term (current) drug therapy: Secondary | ICD-10-CM | POA: Diagnosis not present

## 2017-03-11 DIAGNOSIS — I509 Heart failure, unspecified: Secondary | ICD-10-CM | POA: Diagnosis not present

## 2017-03-11 DIAGNOSIS — R1031 Right lower quadrant pain: Secondary | ICD-10-CM

## 2017-03-11 DIAGNOSIS — N1330 Unspecified hydronephrosis: Secondary | ICD-10-CM | POA: Diagnosis not present

## 2017-03-11 DIAGNOSIS — E1122 Type 2 diabetes mellitus with diabetic chronic kidney disease: Secondary | ICD-10-CM | POA: Insufficient documentation

## 2017-03-11 DIAGNOSIS — R109 Unspecified abdominal pain: Secondary | ICD-10-CM

## 2017-03-11 DIAGNOSIS — Z794 Long term (current) use of insulin: Secondary | ICD-10-CM | POA: Diagnosis not present

## 2017-03-11 LAB — CBC WITH DIFFERENTIAL/PLATELET
Basophils Absolute: 0.1 10*3/uL (ref 0–0.1)
Basophils Relative: 1 %
EOS ABS: 0.1 10*3/uL (ref 0–0.7)
EOS PCT: 1 %
HCT: 36.8 % (ref 35.0–47.0)
Hemoglobin: 12.2 g/dL (ref 12.0–16.0)
Lymphocytes Relative: 8 %
Lymphs Abs: 1.2 10*3/uL (ref 1.0–3.6)
MCH: 28.6 pg (ref 26.0–34.0)
MCHC: 33.3 g/dL (ref 32.0–36.0)
MCV: 86 fL (ref 80.0–100.0)
MONO ABS: 1 10*3/uL — AB (ref 0.2–0.9)
Monocytes Relative: 6 %
NEUTROS ABS: 13.1 10*3/uL — AB (ref 1.4–6.5)
NEUTROS PCT: 84 %
PLATELETS: 186 10*3/uL (ref 150–440)
RBC: 4.27 MIL/uL (ref 3.80–5.20)
RDW: 14 % (ref 11.5–14.5)
WBC: 15.4 10*3/uL — AB (ref 3.6–11.0)

## 2017-03-11 LAB — URINALYSIS, COMPLETE (UACMP) WITH MICROSCOPIC
BILIRUBIN URINE: NEGATIVE
HGB URINE DIPSTICK: NEGATIVE
KETONES UR: 5 mg/dL — AB
NITRITE: NEGATIVE
PH: 6 (ref 5.0–8.0)
Protein, ur: 100 mg/dL — AB
Specific Gravity, Urine: 1.015 (ref 1.005–1.030)

## 2017-03-11 LAB — COMPREHENSIVE METABOLIC PANEL
ALT: 16 U/L (ref 14–54)
AST: 23 U/L (ref 15–41)
Albumin: 3.9 g/dL (ref 3.5–5.0)
Alkaline Phosphatase: 89 U/L (ref 38–126)
Anion gap: 11 (ref 5–15)
BUN: 40 mg/dL — ABNORMAL HIGH (ref 6–20)
CHLORIDE: 106 mmol/L (ref 101–111)
CO2: 19 mmol/L — ABNORMAL LOW (ref 22–32)
Calcium: 9 mg/dL (ref 8.9–10.3)
Creatinine, Ser: 3.43 mg/dL — ABNORMAL HIGH (ref 0.44–1.00)
GFR, EST AFRICAN AMERICAN: 14 mL/min — AB (ref >60)
GFR, EST NON AFRICAN AMERICAN: 12 mL/min — AB (ref >60)
Glucose, Bld: 249 mg/dL — ABNORMAL HIGH (ref 65–99)
POTASSIUM: 4.3 mmol/L (ref 3.5–5.1)
Sodium: 136 mmol/L (ref 135–145)
Total Bilirubin: 0.9 mg/dL (ref 0.3–1.2)
Total Protein: 7.3 g/dL (ref 6.5–8.1)

## 2017-03-11 LAB — LIPASE, BLOOD: LIPASE: 31 U/L (ref 11–51)

## 2017-03-11 MED ORDER — SODIUM CHLORIDE 0.9 % IV BOLUS (SEPSIS)
1000.0000 mL | Freq: Once | INTRAVENOUS | Status: AC
  Administered 2017-03-11: 1000 mL via INTRAVENOUS

## 2017-03-11 MED ORDER — MORPHINE SULFATE (PF) 4 MG/ML IV SOLN
INTRAVENOUS | Status: AC
  Administered 2017-03-11: 4 mg via INTRAVENOUS
  Filled 2017-03-11: qty 1

## 2017-03-11 MED ORDER — CEPHALEXIN 500 MG PO CAPS
500.0000 mg | ORAL_CAPSULE | Freq: Once | ORAL | Status: AC
  Administered 2017-03-11: 500 mg via ORAL
  Filled 2017-03-11: qty 1

## 2017-03-11 MED ORDER — MORPHINE SULFATE (PF) 4 MG/ML IV SOLN
4.0000 mg | Freq: Once | INTRAVENOUS | Status: AC
  Administered 2017-03-11: 4 mg via INTRAVENOUS
  Filled 2017-03-11: qty 1

## 2017-03-11 MED ORDER — KETOROLAC TROMETHAMINE 30 MG/ML IJ SOLN
15.0000 mg | Freq: Once | INTRAMUSCULAR | Status: AC
  Administered 2017-03-11: 15 mg via INTRAVENOUS
  Filled 2017-03-11: qty 1

## 2017-03-11 MED ORDER — ONDANSETRON HCL 4 MG PO TABS: 4.0000 mg | ORAL_TABLET | Freq: Every day | ORAL | 0 refills | Status: AC | PRN

## 2017-03-11 MED ORDER — MORPHINE SULFATE (PF) 4 MG/ML IV SOLN
4.0000 mg | Freq: Once | INTRAVENOUS | Status: AC
Start: 2017-03-11 — End: 2017-03-11
  Administered 2017-03-11: 4 mg via INTRAVENOUS

## 2017-03-11 MED ORDER — ONDANSETRON HCL 4 MG/2ML IJ SOLN
4.0000 mg | Freq: Once | INTRAMUSCULAR | Status: AC
  Administered 2017-03-11: 4 mg via INTRAVENOUS
  Filled 2017-03-11: qty 2

## 2017-03-11 MED ORDER — CEPHALEXIN 500 MG PO CAPS: 500.0000 mg | ORAL_CAPSULE | Freq: Three times a day (TID) | ORAL | 0 refills | Status: AC

## 2017-03-11 MED ORDER — OXYCODONE-ACETAMINOPHEN 5-325 MG PO TABS: 1.0000 | ORAL_TABLET | Freq: Four times a day (QID) | ORAL | 0 refills | Status: AC | PRN

## 2017-03-11 NOTE — ED Notes (Signed)
Pt requested to be sat up in bed.

## 2017-03-11 NOTE — ED Provider Notes (Signed)
[facility] ____________________________________________  Time seen: Approximately 2:50 PM  I have reviewed the triage vital signs and the nursing notes.   HISTORY  Chief Complaint Abdominal Pain; Emesis; and Nausea   HPI Emily Osborn is a 75 y.o. female presenting for evaluation of 10 out of 10 right lower quadrant to right flank abdominal pain that started last night. Patient reports that she was doing a lot of cleaning in the kitchen yesterday and initially thought she may have done something but quickly realized this was not a pulled muscle. Patient does report she has a history of kidney stones but denies similar presentation with her kidney stones as she states her pain is more to the front of her lower abdomen. Denies any fall or other injury. Reports continues to urinate well. Denies hematuria. Reports she has been having accompanying diarrhea as well as vomiting. Patient states that she has vomited multiple times today with a few episodes of diarrhea. States tolerated some fluids but no food today. Denies current nausea sensation. Denies any alleviating or aggravating factors to pain. Patient states that she needs pain medication now.  Denies chest pain, shortness of breath, dysuria. Reports history of renal insufficiency. Reports has not been able to take her daily blood pressure medicines today.   Past Medical History:  Diagnosis Date  . Arthritis   . CHF (congestive heart failure) (HCC)   . Chronic kidney disease    RENAL INSUFF  . Coronary artery disease   . Depression   . Diabetes mellitus without complication (HCC)   . Edema    LEGS/FEET  . Hypertension   . Panic attacks   . Renal disorder   . Shortness of breath dyspnea    WITH HEAT OR EXERTION  . Sleep apnea    NO CPAP  . Sleeps in sitting position due to orthopnea   . Stroke (HCC)    X 2    2000/ 2001   TIA 1990S  . Stroke [facility])     Patient Active Problem List   Diagnosis Date Noted  .  Hypoglycemia 02/01/2017  . Diabetes (HCC) 02/01/2017  . HTN (hypertension) 02/01/2017  . CKD (chronic kidney disease) 02/01/2017  . Diabetes (HCC) 11/30/2016  . Essential hypertension, benign 11/30/2016  . Hyperlipidemia 11/30/2016  . Chronic kidney disease, stage V (HCC) 11/30/2016  . Right knee pain 08/26/2016    Past Surgical History:  Procedure Laterality Date  . CATARACT EXTRACTION W/PHACO Right 06/15/2016   Procedure: CATARACT EXTRACTION PHACO AND INTRAOCULAR LENS PLACEMENT (IOC);  Surgeon: Galen Manila, MD;  Location: [facility] ORS;  Service: Ophthalmology;  Laterality: Right;  [place] 1.53 AP% 21.4 CDE 24.41 Fluid bag lot # [id number]  . CORONARY ANGIOPLASTY     STENTS 2001  . NO PAST SURGERIES    . TONSILLECTOMY       No current facility-administered medications for this encounter.   Current Outpatient Prescriptions:  .  acetaminophen (TYLENOL) 500 MG tablet, Take 500 mg by mouth every 6 (six) hours as needed for mild pain., Disp: , Rfl:  .  amLODipine (NORVASC) 10 MG tablet, Take 10 mg by mouth daily., Disp: , Rfl:  .  amLODipine (NORVASC) 10 MG tablet, Take 10 mg by mouth daily., Disp: , Rfl:  .  cephALEXin (KEFLEX) 500 MG capsule, Take 1 capsule (500 mg total) by mouth 3 (three) times daily., Disp: 28 capsule, Rfl: 0 .  docusate sodium (COLACE) 100 MG capsule, Take 100 mg by mouth daily as  needed for mild constipation., Disp: , Rfl:  .  docusate sodium (COLACE) 100 MG capsule, Take 100 mg by mouth daily as needed for mild constipation., Disp: , Rfl:  .  furosemide (LASIX) 40 MG tablet, Take 40 mg by mouth daily. , Disp: , Rfl:  .  glipiZIDE (GLUCOTROL) 10 MG tablet, Take 1 tablet by mouth 2 (two) times daily., Disp: , Rfl:  .  glipiZIDE (GLUCOTROL) 10 MG tablet, Take 10 mg by mouth 2 (two) times daily before a meal., Disp: , Rfl:  .  guaifenesin (HUMIBID E) 400 MG TABS tablet, Take 400 mg by mouth every 4 (four) hours as needed (mucus)., Disp: , Rfl:  .  insulin aspart  (NOVOLOG FLEXPEN) 100 UNIT/ML FlexPen, Inject 15 Units into the skin 3 (three) times daily with meals., Disp: 15 mL, Rfl: 11 .  insulin glargine (LANTUS) 100 unit/mL SOPN, Inject 30 Units into the skin at bedtime., Disp: , Rfl:  .  irbesartan (AVAPRO) 300 MG tablet, , Disp: , Rfl:  .  irbesartan (AVAPRO) 300 MG tablet, Take 300 mg by mouth daily., Disp: , Rfl:  .  LANTUS SOLOSTAR 100 UNIT/ML Solostar Pen, Take 28 Units by mouth at bedtime., Disp: , Rfl:  .  loratadine (CLARITIN) 10 MG tablet, Take 10 mg by mouth daily as needed for allergies., Disp: , Rfl:  .  losartan (COZAAR) 100 MG tablet, Take 100 mg by mouth daily., Disp: , Rfl:  .  NOVOLOG FLEXPEN 100 UNIT/ML FlexPen, Inject 18 Units into the skin 3 (three) times daily., Disp: , Rfl:  .  ondansetron (ZOFRAN) 4 MG tablet, Take 1 tablet (4 mg total) by mouth daily as needed for nausea or vomiting., Disp: 10 tablet, Rfl: 0 .  oxyCODONE (ROXICODONE) 15 MG immediate release tablet, Take 1 tablet by mouth 2 (two) times daily as needed for pain. , Disp: , Rfl:  .  Oxycodone HCl 20 MG TABS, Take 20 mg by mouth 3 (three) times daily as needed (pain). , Disp: , Rfl:  .  oxyCODONE-acetaminophen (ROXICET) 5-325 MG tablet, Take 1 tablet by mouth every 6 (six) hours as needed., Disp: 8 tablet, Rfl: 0 .  PARoxetine (PAXIL-CR) 25 MG 24 hr tablet, Take 2 tablets by mouth daily., Disp: , Rfl:  .  PARoxetine (PAXIL-CR) 25 MG 24 hr tablet, Take 50 mg by mouth daily., Disp: , Rfl:  .  pravastatin (PRAVACHOL) 80 MG tablet, Take 1 tablet by mouth at bedtime. , Disp: , Rfl:  .  pravastatin (PRAVACHOL) 80 MG tablet, Take 80 mg by mouth at bedtime., Disp: , Rfl:  .  sodium bicarbonate 650 MG tablet, Take 650 mg by mouth 2 (two) times daily., Disp: , Rfl:  .  sodium bicarbonate 650 MG tablet, Take 650 mg by mouth 2 (two) times daily., Disp: , Rfl:  .  traMADol (ULTRAM) 50 MG tablet, Take by mouth., Disp: , Rfl:   Allergies Patient has no known allergies.  Family  History  Problem Relation Age of Onset  . Diabetes Mother   . Alzheimer's disease Mother   . Diabetes Father   . CAD Father     Social History Social History  Substance Use Topics  . Smoking status: Never Smoker  . Smokeless tobacco: Never Used  . Alcohol use No    Review of Systems Constitutional: No fever/chills Cardiovascular: Denies chest pain. Respiratory: Denies shortness of breath. Gastrointestinal: As above. Genitourinary: Negative for dysuria. Musculoskeletal: Negative for back pain. Skin: Negative for  rash.  ____________________________________________   PHYSICAL EXAM:  VITAL SIGNS: ED Triage Vitals  Enc Vitals Group     BP 03/11/17 1439 (!) 200/123     Pulse Rate 03/11/17 1439 98     Resp 03/11/17 1439 16     Temp 03/11/17 1439 99 F (37.2 C)     Temp Source 03/11/17 1439 Oral     SpO2 03/11/17 1439 99 %     Weight 03/11/17 1440 243 lb (110.2 kg)     Height 03/11/17 1440  (1.6 m)     Head Circumference --      Peak Flow --      Pain Score 03/11/17 1441 10     Pain Loc --      Pain Edu? --      Excl. in GC? --     Constitutional: Alert and oriented. Well appearing and in no acute distress. Cardiovascular: Normal rate, regular rhythm. Grossly normal heart sounds.  Good peripheral circulation. Respiratory: Normal respiratory effort without tachypnea nor retractions. Breath sounds are clear and equal bilaterally. No wheezes, rales, rhonchi. Gastrointestinal: Soft and nontender. Obese abdomen. No CVA tenderness. Musculoskeletal: Steady gait. No midline cervical, thoracic or lumbar tenderness to palpation.  Neurologic:  Normal speech and language. Speech is normal. No gait instability.  Skin:  Skin is warm, dry and intact. No rash noted. Psychiatric: Mood and affect are normal. Speech and behavior are normal. Patient exhibits appropriate insight and judgment   ___________________________________________   LABS (all labs ordered are listed, but  only abnormal results are displayed)  Labs Reviewed - No data to display   PROCEDURES Procedures     INITIAL IMPRESSION / ASSESSMENT AND PLAN / ED COURSE  Pertinent labs & imaging results that were available during my care of the patient were reviewed by me and considered in my medical decision making (see chart for details).  Patient presenting to urgent care guarding abdomen and seeing that she needs pain medication upon walking into exam room. Patient hypertensive and reports unable to tolerate oral medications or fluids today. Patient declines nausea medicine at this time. Patient reports no previous abdominal surgeries and does report history of kidney stones. Patient states pain does not feel like previous kidney stones. Patient with obese abdomen and no direct pain to palpation. His pain is 10 out of 10 and patient continually states right lower abdomen recommend patient to go to emergency room for further evaluation and likely imaging of abdomen. Patient agrees to this plan and states that her friend will drive her to [facility]. [place]. RN called and given report. _______________   FINAL CLINICAL IMPRESSION(S) / ED DIAGNOSES  Final diagnoses:  Right lower quadrant abdominal pain     Discharge Medication List as of 03/11/2017  3:06 PM      Note: This dictation was prepared with Dragon dictation along with smaller phrase technology. Any transcriptional errors that result from this process are unintentional.         Renford Dills, NP 03/11/17 2050

## 2017-03-11 NOTE — ED Provider Notes (Addendum)
[facility] Emergency Department Provider Note  ____________________________________________   I have reviewed the triage vital signs and the nursing notes.   HISTORY  Chief Complaint Abdominal Pain    HPI Emily Osborn is a 76 y.o. female who presents today complaining of sudden onset right sided flank and lower abdominal discomfort on the right side. Started yesterday evening. Feels similar to kidney stones. Denies dysuria or urinary frequency or hematuria. Has had some nausea and vomiting from the pain she states. Denies significant diarrhea. Went to minor care and was sent here. Patient states she has had 2 kidney stones in the past but not recently. The pain is sharp, cramping and intense. No radiation, nothing makes it better nothing makes it worse       Past Medical History:  Diagnosis Date  . Arthritis   . CHF (congestive heart failure) (HCC)   . Chronic kidney disease    RENAL INSUFF  . Coronary artery disease   . Depression   . Diabetes mellitus without complication (HCC)   . Edema    LEGS/FEET  . Hypertension   . Panic attacks   . Renal disorder   . Shortness of breath dyspnea    WITH HEAT OR EXERTION  . Sleep apnea    NO CPAP  . Sleeps in sitting position due to orthopnea   . Stroke (HCC)    X 2    2000/ 2001   TIA 1990S  . Stroke [facility])     Patient Active Problem List   Diagnosis Date Noted  . Hypoglycemia 02/01/2017  . Diabetes (HCC) 02/01/2017  . HTN (hypertension) 02/01/2017  . CKD (chronic kidney disease) 02/01/2017  . Diabetes (HCC) 11/30/2016  . Essential hypertension, benign 11/30/2016  . Hyperlipidemia 11/30/2016  . Chronic kidney disease, stage V (HCC) 11/30/2016  . Right knee pain 08/26/2016    Past Surgical History:  Procedure Laterality Date  . CATARACT EXTRACTION W/PHACO Right 06/15/2016   Procedure: CATARACT EXTRACTION PHACO AND INTRAOCULAR LENS PLACEMENT (IOC);  Surgeon: Galen Manila, MD;  Location:  [facility] ORS;  Service: Ophthalmology;  Laterality: Right;  [place] 1.53 AP% 21.4 CDE 24.41 Fluid bag lot # [id number]  . CORONARY ANGIOPLASTY     STENTS 2001  . NO PAST SURGERIES    . TONSILLECTOMY      Prior to Admission medications   Medication Sig Start Date End Date Taking? Authorizing Provider  acetaminophen (TYLENOL) 500 MG tablet Take 500 mg by mouth every 6 (six) hours as needed for mild pain.    Historical Provider, MD  amLODipine (NORVASC) 10 MG tablet Take 10 mg by mouth daily.    Historical Provider, MD  amLODipine (NORVASC) 10 MG tablet Take 10 mg by mouth daily.    Historical Provider, MD  docusate sodium (COLACE) 100 MG capsule Take 100 mg by mouth daily as needed for mild constipation.    Historical Provider, MD  docusate sodium (COLACE) 100 MG capsule Take 100 mg by mouth daily as needed for mild constipation.    Historical Provider, MD  furosemide (LASIX) 40 MG tablet Take 40 mg by mouth daily.     Historical Provider, MD  glipiZIDE (GLUCOTROL) 10 MG tablet Take 1 tablet by mouth 2 (two) times daily. 03/11/16   Historical Provider, MD  glipiZIDE (GLUCOTROL) 10 MG tablet Take 10 mg by mouth 2 (two) times daily before a meal.    Historical Provider, MD  guaifenesin (HUMIBID E) 400 MG TABS tablet Take 400  mg by mouth every 4 (four) hours as needed (mucus).    Historical Provider, MD  insulin aspart (NOVOLOG FLEXPEN) 100 UNIT/ML FlexPen Inject 15 Units into the skin 3 (three) times daily with meals. 02/02/17   Houston Siren, MD  insulin glargine (LANTUS) 100 unit/mL SOPN Inject 30 Units into the skin at bedtime.    Historical Provider, MD  irbesartan (AVAPRO) 300 MG tablet  11/03/16   Historical Provider, MD  irbesartan (AVAPRO) 300 MG tablet Take 300 mg by mouth daily.    Historical Provider, MD  LANTUS SOLOSTAR 100 UNIT/ML Solostar Pen Take 28 Units by mouth at bedtime. 03/11/16   Historical Provider, MD  loratadine (CLARITIN) 10 MG tablet Take 10 mg by mouth daily as needed for  allergies.    Historical Provider, MD  losartan (COZAAR) 100 MG tablet Take 100 mg by mouth daily.    Historical Provider, MD  NOVOLOG FLEXPEN 100 UNIT/ML FlexPen Inject 18 Units into the skin 3 (three) times daily. 03/11/16   Historical Provider, MD  oxyCODONE (ROXICODONE) 15 MG immediate release tablet Take 1 tablet by mouth 2 (two) times daily as needed for pain.  04/05/16   Historical Provider, MD  Oxycodone HCl 20 MG TABS Take 20 mg by mouth 3 (three) times daily as needed (pain).     Historical Provider, MD  PARoxetine (PAXIL-CR) 25 MG 24 hr tablet Take 2 tablets by mouth daily. 03/11/16   Historical Provider, MD  PARoxetine (PAXIL-CR) 25 MG 24 hr tablet Take 50 mg by mouth daily.    Historical Provider, MD  pravastatin (PRAVACHOL) 80 MG tablet Take 1 tablet by mouth at bedtime.  02/28/16   Historical Provider, MD  pravastatin (PRAVACHOL) 80 MG tablet Take 80 mg by mouth at bedtime.    Historical Provider, MD  sodium bicarbonate 650 MG tablet Take 650 mg by mouth 2 (two) times daily.    Historical Provider, MD  sodium bicarbonate 650 MG tablet Take 650 mg by mouth 2 (two) times daily.    Historical Provider, MD  traMADol (ULTRAM) 50 MG tablet Take by mouth. 09/15/16   Historical Provider, MD    Allergies Patient has no known allergies.  Family History  Problem Relation Age of Onset  . Diabetes Mother   . Alzheimer's disease Mother   . Diabetes Father   . CAD Father     Social History Social History  Substance Use Topics  . Smoking status: Never Smoker  . Smokeless tobacco: Never Used  . Alcohol use No    Review of Systems Constitutional: No fever/chills Eyes: No visual changes. ENT: No sore throat. No stiff neck no neck pain Cardiovascular: Denies chest pain. Respiratory: Denies shortness of breath. Gastrointestinal:   Positive vomiting.  No diarrhea.  No constipation. Genitourinary: Negative for dysuria. Musculoskeletal: Negative lower extremity swelling Skin: Negative for  rash. Neurological: Negative for severe headaches, focal weakness or numbness. 10-point ROS otherwise negative.  ____________________________________________   PHYSICAL EXAM:  VITAL SIGNS: ED Triage Vitals  Enc Vitals Group     BP 03/11/17 1529 (!) 209/95     Pulse Rate 03/11/17 1647 94     Resp 03/11/17 1524 16     Temp 03/11/17 1524 98.6 F (37 C)     Temp Source 03/11/17 1524 Oral     SpO2 03/11/17 1524 97 %     Weight --      Height --      Head Circumference --  Peak Flow --      Pain Score 03/11/17 1530 10     Pain Loc --      Pain Edu? --      Excl. in GC? --     Constitutional: Alert and oriented. Well appearing and in no acute distress. Eyes: Conjunctivae are normal. PERRL. EOMI. Head: Atraumatic. Nose: No congestion/rhinnorhea. Mouth/Throat: Mucous membranes are moist.  Oropharynx non-erythematous. Neck: No stridor.   Nontender with no meningismus Cardiovascular: Normal rate, regular rhythm. Grossly normal heart sounds.  Good peripheral circulation. Respiratory: Normal respiratory effort.  No retractions. Lungs CTAB. Abdominal: Soft and Palpation right lower quadrant with voluntary guarding but no rebound and no surgical signs noted. No evidence of peritonitis. No distention.Back:  There is no focal tenderness or step off.  there is no midline tenderness there are no lesions noted. there is Mild right CVA tenderness Musculoskeletal: No lower extremity tenderness, no upper extremity tenderness. No joint effusions, no DVT signs strong distal pulses no edema Neurologic:  Normal speech and language. No gross focal neurologic deficits are appreciated.  Skin:  Skin is warm, dry and intact. No rash noted. Psychiatric: Mood and affect are normal. Speech and behavior are normal.  ____________________________________________   LABS (all labs ordered are listed, but only abnormal results are displayed)  Labs Reviewed  CBC WITH DIFFERENTIAL/PLATELET - Abnormal;  Notable for the following:       Result Value   WBC 15.4 (*)    Neutro Abs 13.1 (*)    Monocytes Absolute 1.0 (*)    All other components within normal limits  COMPREHENSIVE METABOLIC PANEL - Abnormal; Notable for the following:    CO2 19 (*)    Glucose, Bld 249 (*)    BUN 40 (*)    Creatinine, Ser 3.43 (*)    GFR calc non Af Amer 12 (*)    GFR calc Af Amer 14 (*)    All other components within normal limits  LIPASE, BLOOD  URINALYSIS, COMPLETE (UACMP) WITH MICROSCOPIC   ____________________________________________  EKG  I personally interpreted any EKGs ordered by me or triage  ____________________________________________  RADIOLOGY  I reviewed any imaging ordered by me or triage that were performed during my shift and, if possible, patient and/or family made aware of any abnormal findings. ____________________________________________   PROCEDURES  Procedure(s) performed: None  Procedures  Critical Care performed: None  ____________________________________________   INITIAL IMPRESSION / ASSESSMENT AND PLAN / ED COURSE  Pertinent labs & imaging results that were available during my care of the patient were reviewed by me and considered in my medical decision making (see chart for details).  Patient with a history of kidney stones presents with sudden onset right flank pain going towards her groin with some degree of reproducible in both of those areas. CT scan shows hydronephrosis. Differential does include passed kidney stone. CT show no evidence of AAA on CT. Nothing discussed just appendicitis. History certainly isn't consistent with that either. We are giving her pain medication, IV fluids, she is more comfortable but still having pain. We're waiting for urinalysis and after which we will discuss mostly with urology depending on patient clinical picture.  ----------------------------------------- 6:51 PM on  03/11/2017 -----------------------------------------  Patient in no acute distress Toradol completely removed her symptoms. Questionable urinary tract infection although very unlikely to be the actual source of her symptoms. We will nonetheless appear to treat her with antibiotics. I did discuss with urology. Dr. Griffin Dakin and he agrees with management  and discharge including Percodan antibiosis. We discussed CT findings, lab results, blood work, exam and vitals etc. They will follow-up as an outpatient. He says return precautions and follow up given to the patient. Most likely this is a passed kidney stone. Pain free and smiling on discharge.    ____________________________________________   FINAL CLINICAL IMPRESSION(S) / ED DIAGNOSES  Final diagnoses:  None      This chart was dictated using voice recognition software.  Despite best efforts to proofread,  errors can occur which can change meaning.      Jeanmarie Plant, MD 03/11/17 1710    Jeanmarie Plant, MD 03/11/17 1610    Jeanmarie Plant, MD 03/11/17 [phone]

## 2017-03-11 NOTE — Discharge Instructions (Signed)
You likely passed a kidney stone. If you have increased pain, vomiting, fever, especially fever, or you feel worse in any way return immediately to the emergency department. Take the antibiotics as directed. Follow up with urology as directed.

## 2017-03-11 NOTE — ED Triage Notes (Signed)
Pt to triage via wheelchair.  Pt has right lower abd pain.  Pt was seen at [facility] urgent care today and sent to er for eval.  Pt alert.

## 2017-03-11 NOTE — ED Triage Notes (Signed)
RLQ abd pain since yesterday evening. Also N/V.

## 2017-03-11 NOTE — ED Notes (Signed)
Patient states she "feels much better" since she voided

## 2017-03-12 LAB — URINE CULTURE

## 2017-03-22 DEATH — deceased

## 2017-08-28 IMAGING — CR DG KNEE COMPLETE 4+V*R*
1 series · 4 of 4 positions shown · non-contrast
Comparison: None.

CLINICAL DATA: PT C/O PAIN FOR 5 DAYS NOW. RIGHT KNEE PAIN. NO
INJURY. MOST OF HER PAIN IS MEDIAL. PT HAS HAD PAIN PREVIOUSLY WITH
THAT KNEE.

EXAM:
RIGHT KNEE - COMPLETE 4+ VIEW

[Series 1: dg knee complete 4 views right · 0.14mm/px · 4 of 4 slices shown]
[im 1/4]
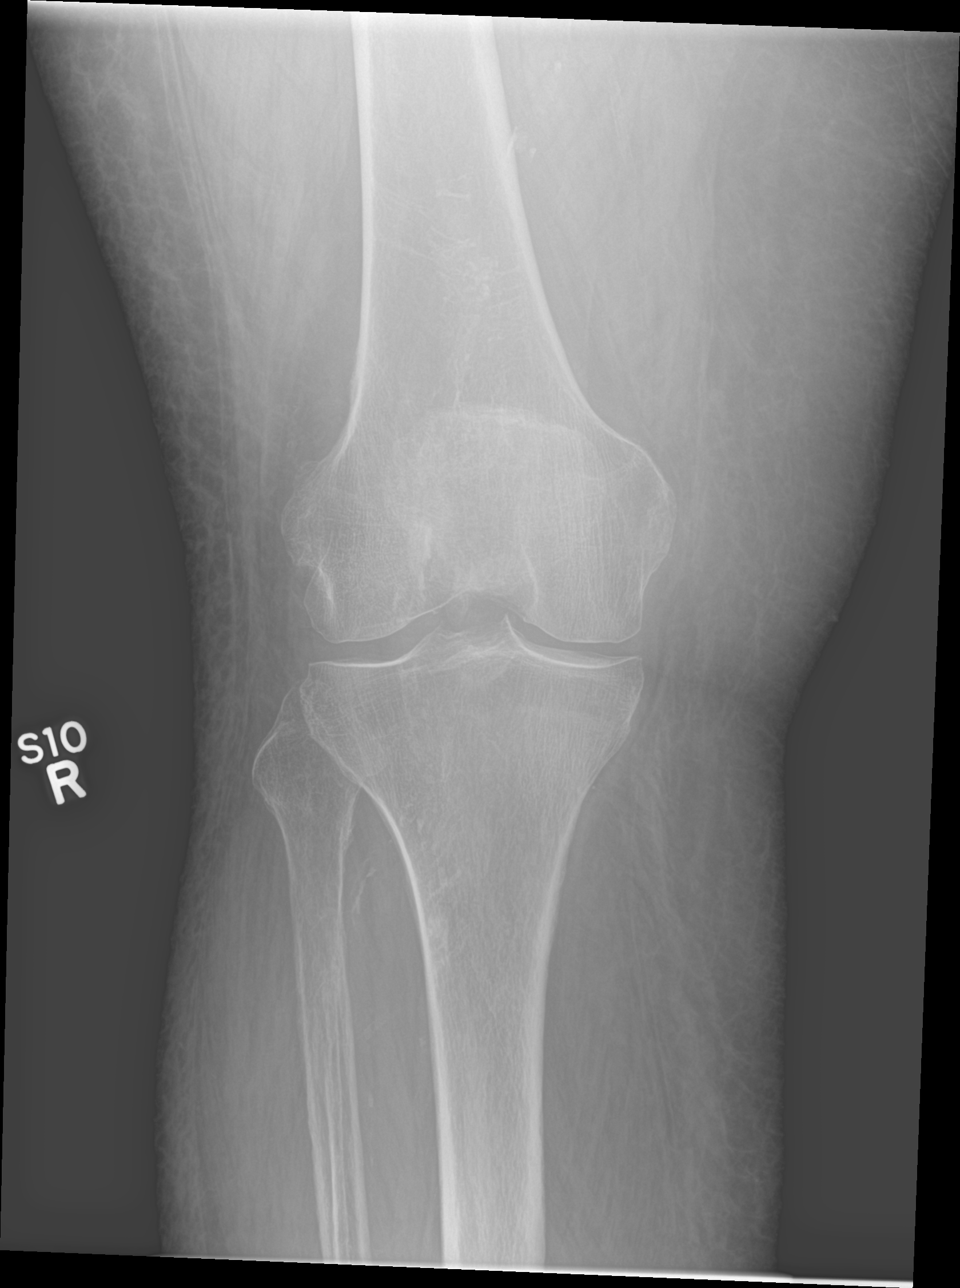
[im 2/4]
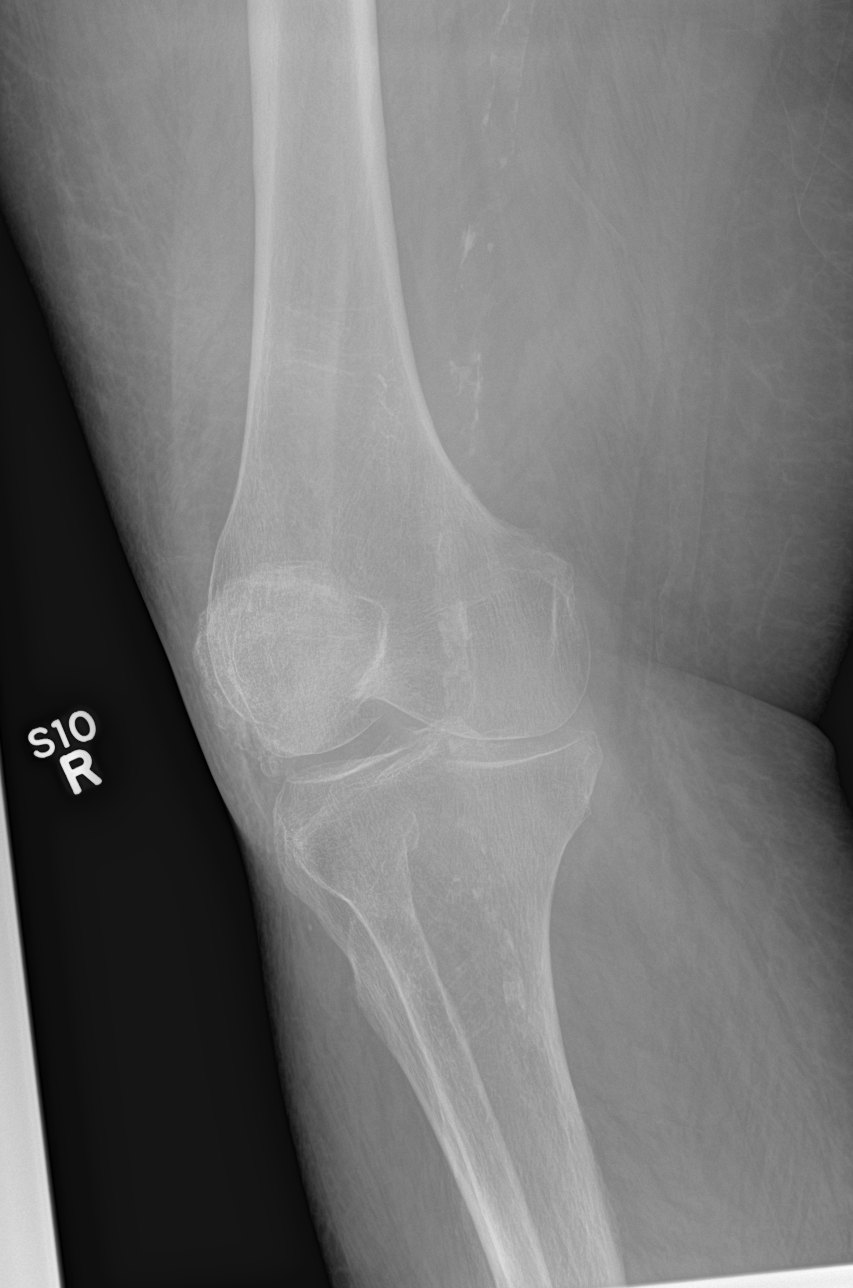
[im 3/4]
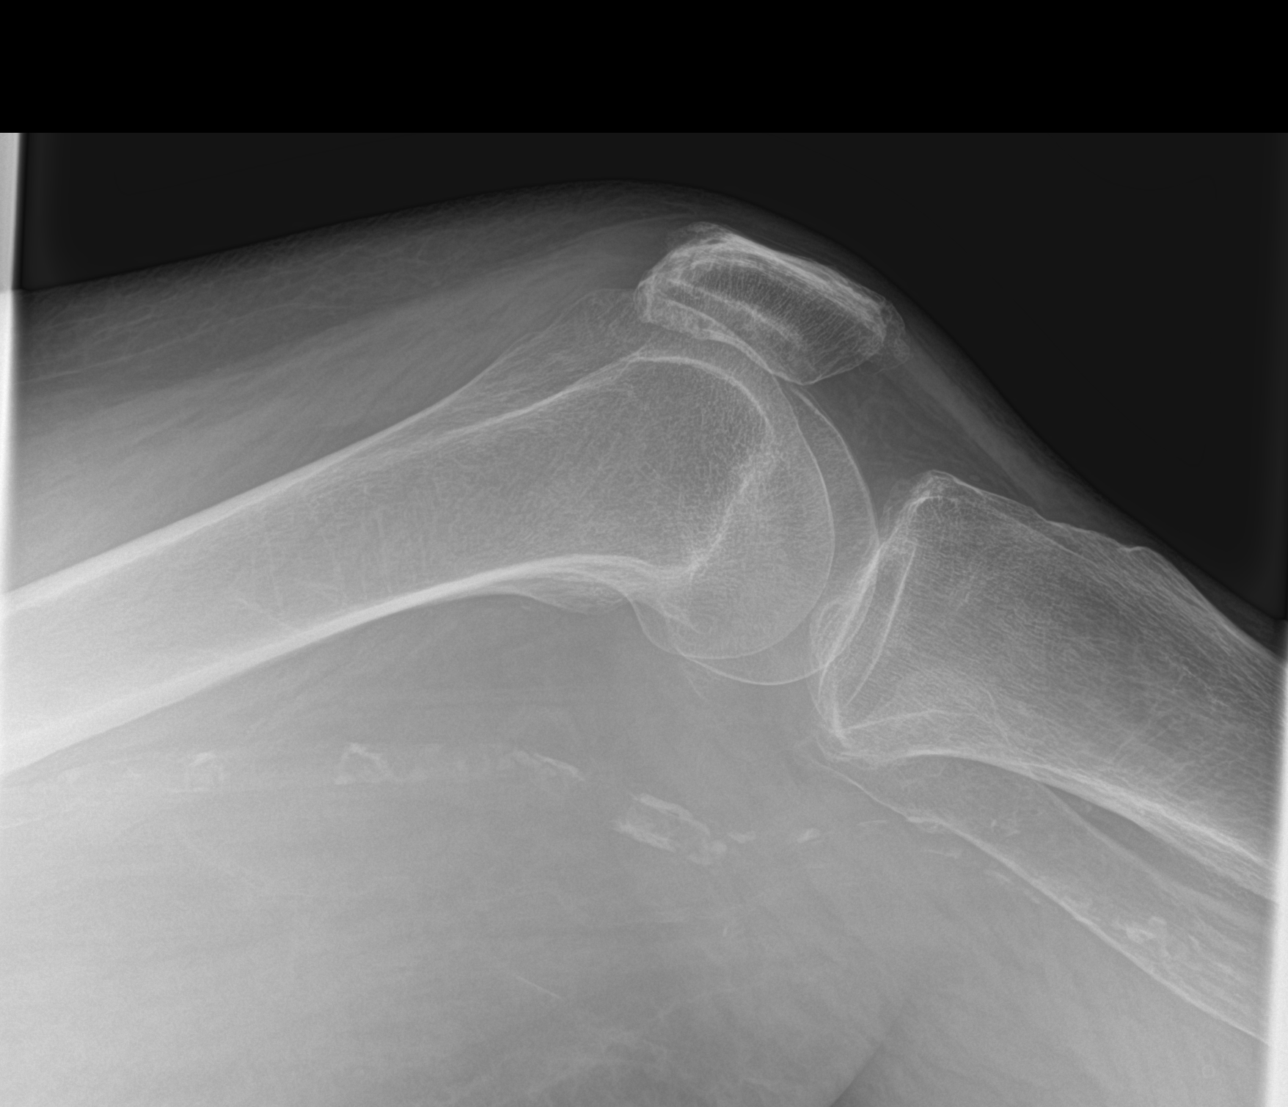
[im 4/4]
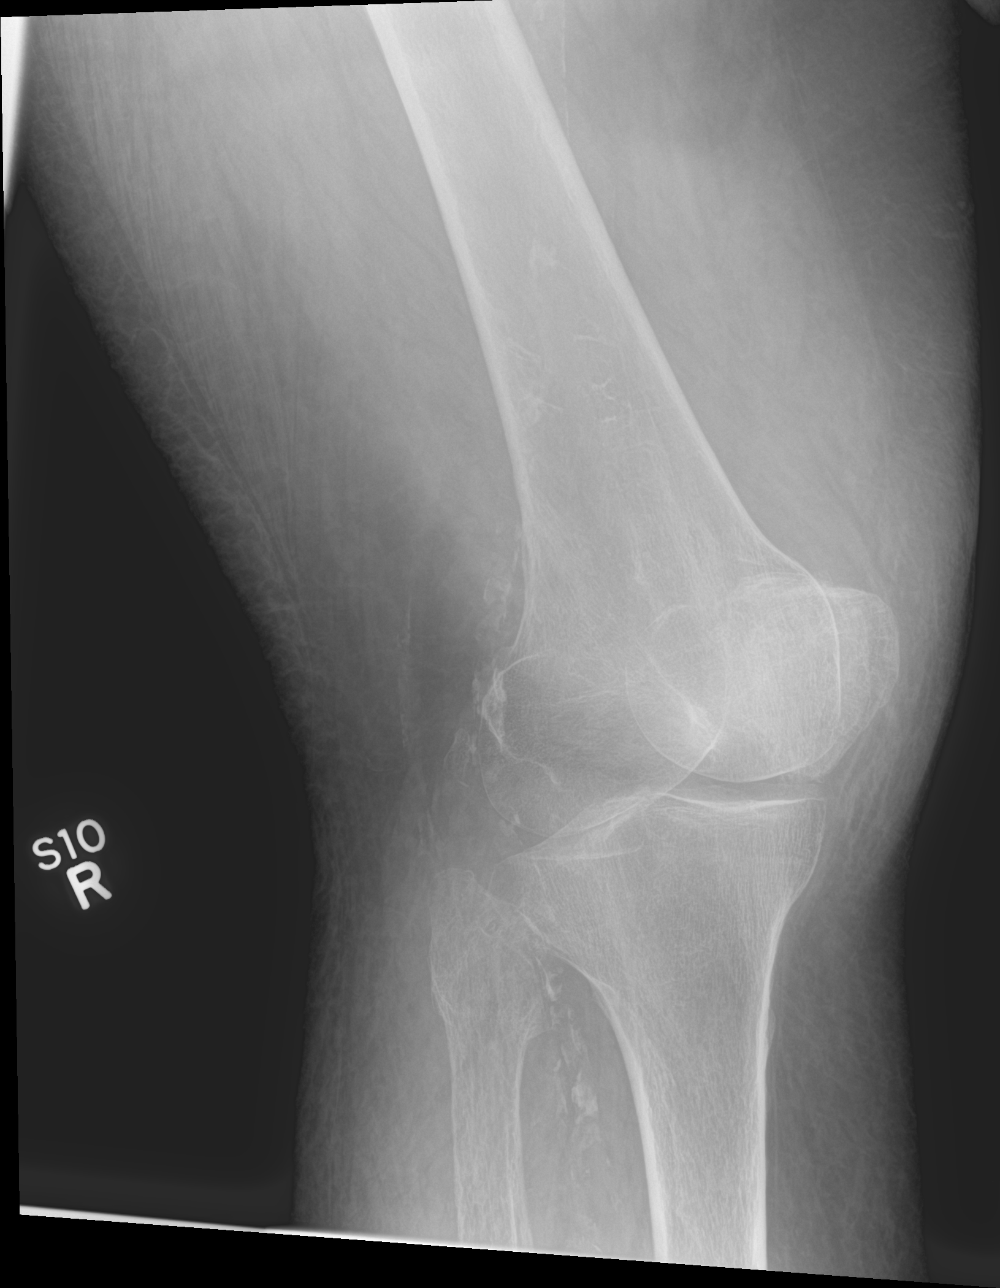

[4 of 4 positions shown; findings below may reference images not displayed]

FINDINGS: Bones appear radiolucent. There is acute fracture or subluxation. No
joint effusion. Joint space narrowing identified in the
patellofemoral and medial compartments. Atherosclerotic
calcification of the popliteal artery.
IMPRESSION: No evidence for acute  abnormality.  Degenerative changes.

## 2017-09-27 IMAGING — US US CAROTID DUPLEX BILAT
1 series · 13 of 24 positions shown · non-contrast
Comparison: CT scan of the head 08/26/2016; prior duplex carotid
ultrasound 01/08/2002

CLINICAL DATA: 75-year-old female with acute cerebral vascular
accident

EXAM:
BILATERAL CAROTID DUPLEX ULTRASOUND
TECHNIQUE: Gray scale imaging, color Doppler and duplex ultrasound were
performed of bilateral carotid and vertebral arteries in the neck.

[Series 1: us carotid duplex bilat · 0.06mm/px · 13 of 74 slices shown]
[im 1/74]
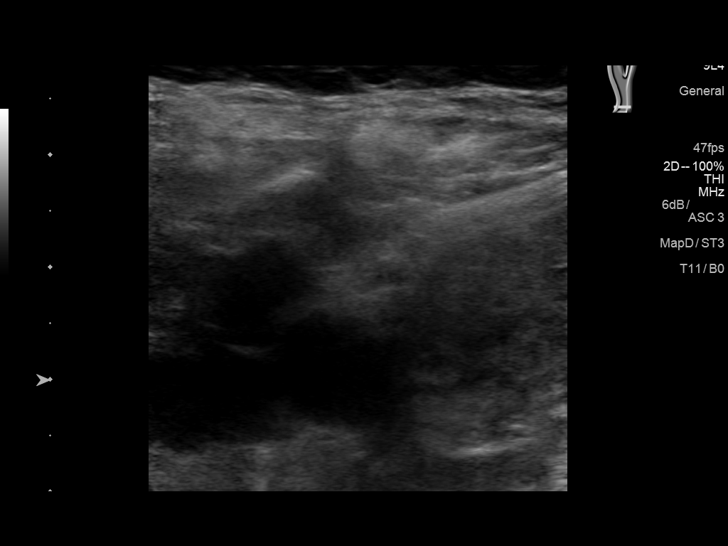
[im 7/74]
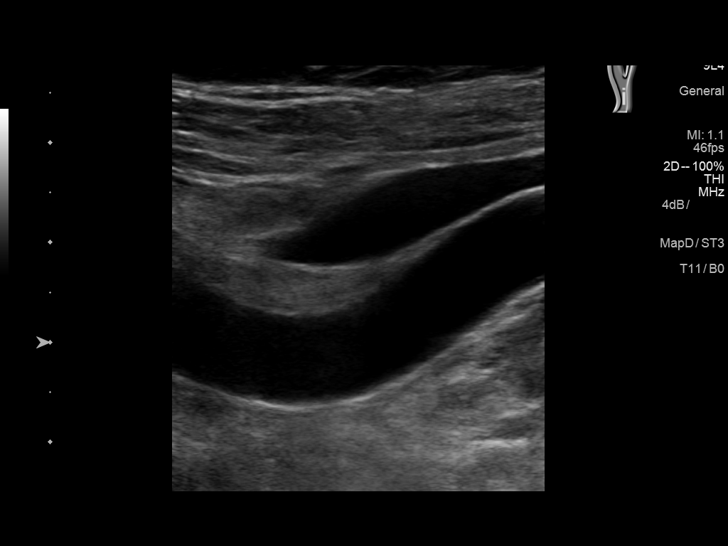
[im 13/74]
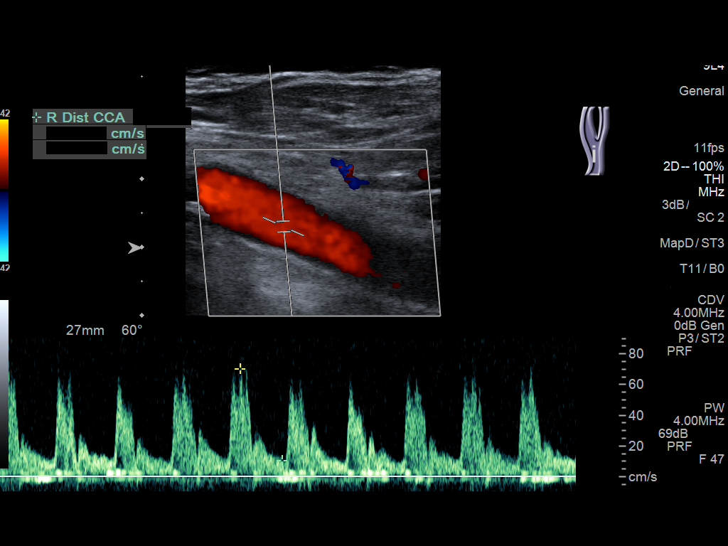
[im 20/74]
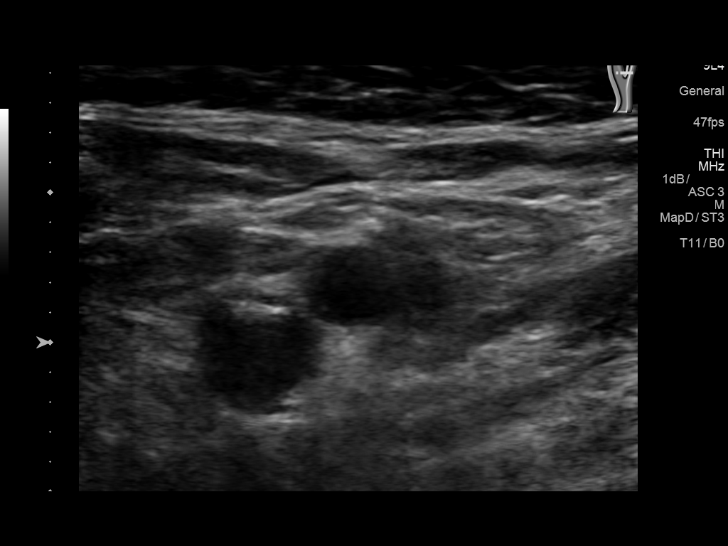
[im 26/74]
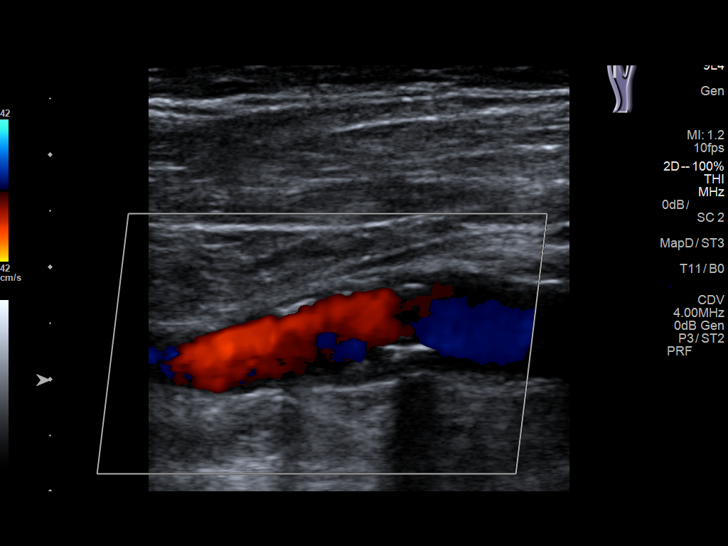
[im 32/74]
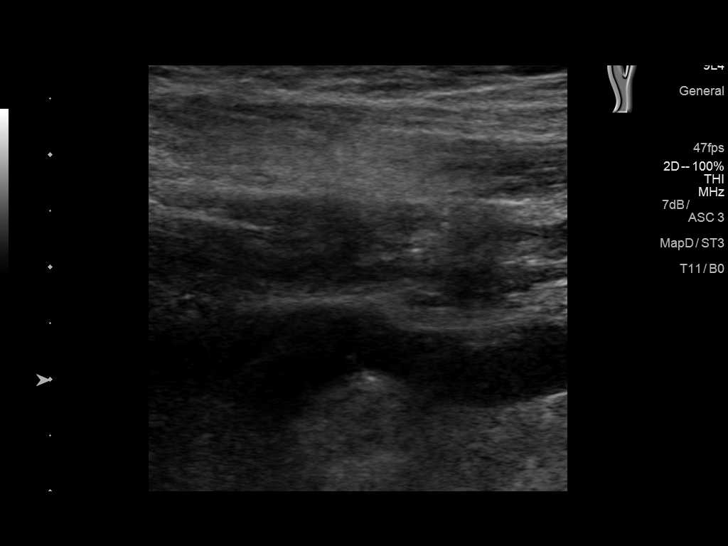
[im 39/74]
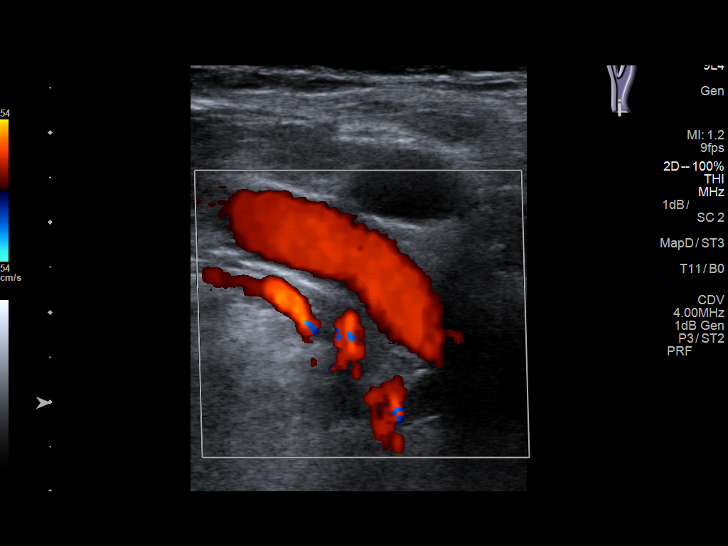
[im 42/74]
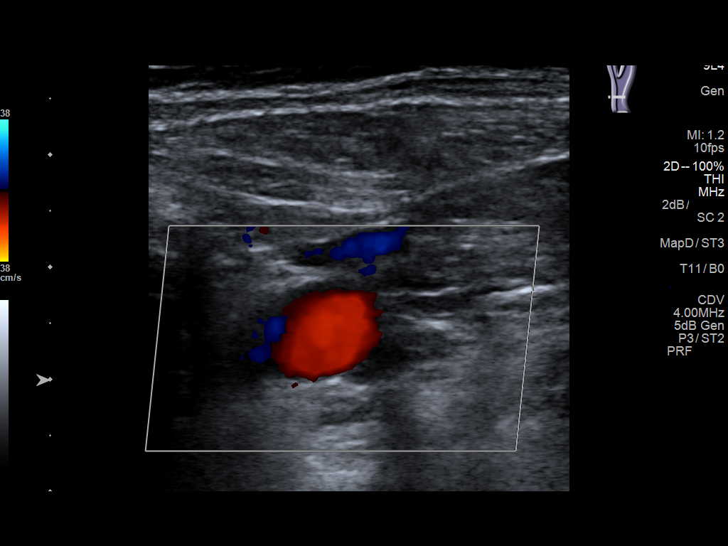
[im 48/74]
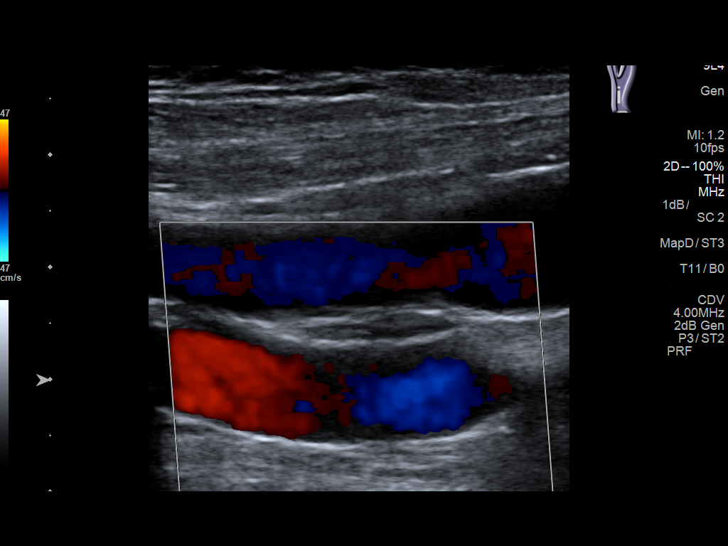
[im 54/74]
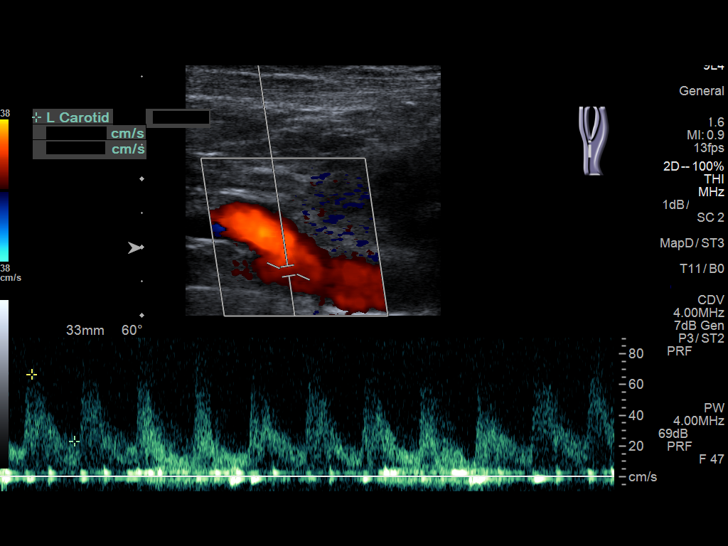
[im 61/74]
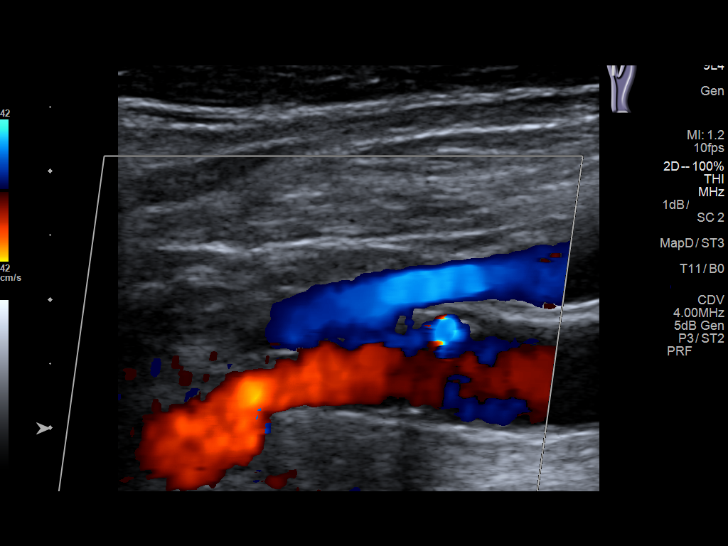
[im 67/74]
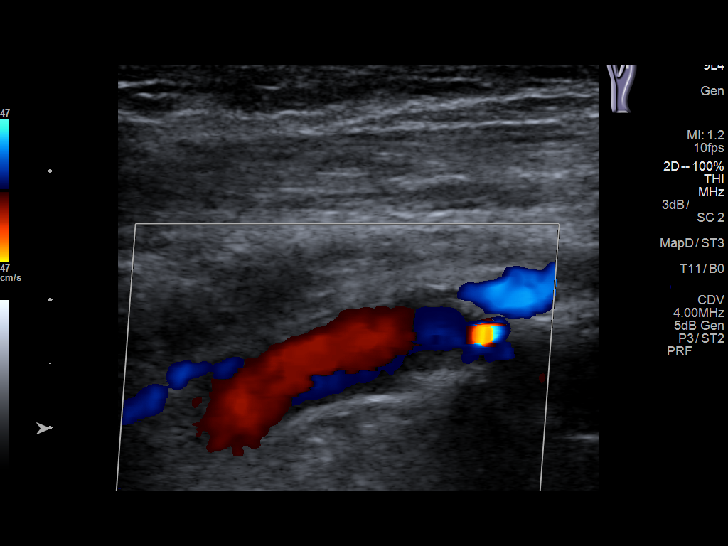
[im 74/74]
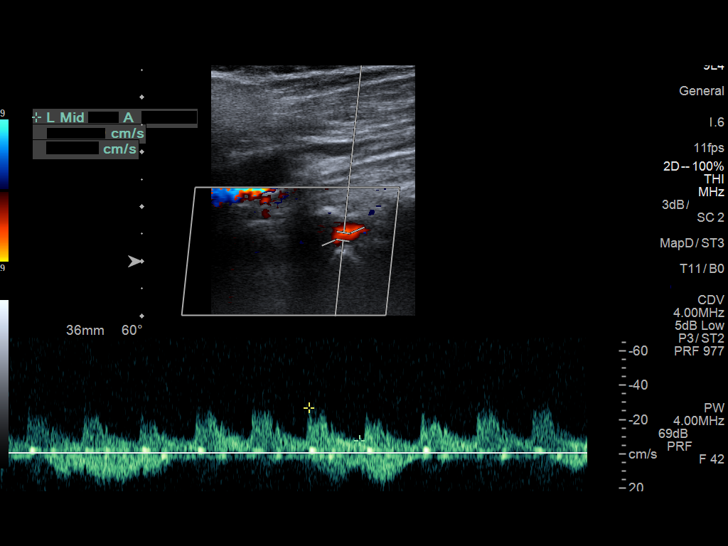

[13 of 24 positions shown; findings below may reference images not displayed]

FINDINGS: Criteria: Quantification of carotid stenosis is based on velocity
parameters that correlate the residual internal carotid diameter
with NASCET-based stenosis levels, using the diameter of the distal
internal carotid lumen as the denominator for stenosis measurement.

The following velocity measurements were obtained:

RIGHT

ICA:  118/31 cm/sec

CCA:  100/10 cm/sec

SYSTOLIC ICA/CCA RATIO:

DIASTOLIC ICA/CCA RATIO:

ECA:  116 cm/sec

LEFT

ICA:  135/40 cm/sec

CCA:  75/12 cm/sec

SYSTOLIC ICA/CCA RATIO:

DIASTOLIC ICA/CCA RATIO:

ECA:  100 cm/sec

RIGHT CAROTID ARTERY: Mild heterogeneous atherosclerotic plaque in
the proximal internal carotid artery. By peak systolic velocity
criteria the estimated stenosis remains less than 50%. The artery is
tortuous distally.

RIGHT VERTEBRAL ARTERY:  Patent with normal antegrade flow.

LEFT CAROTID ARTERY: Heterogeneous and partially calcified
atherosclerotic plaque in the proximal internal carotid artery. By
peak systolic velocity criteria the estimated stenosis falls in the
50- 69% diameter range. There is associated spectral broadening.

LEFT VERTEBRAL ARTERY:  Patent with normal antegrade flow.
IMPRESSION: 1. Mild (1-49%) stenosis proximal right internal carotid artery
secondary to heterogenous atherosclerotic plaque.
2. Moderate (50-69%) stenosis proximal left internal carotid artery
secondary to heterogenous and partially calcified atherosclerotic
plaque.
3. Vertebral arteries are patent with normal antegrade flow.

## 2018-03-13 IMAGING — CT CT RENAL STONE PROTOCOL
2 of 4 series · 15 of 46 positions shown, 17 images · non-contrast
Comparison: Renal ultrasound, 03/11/2015

CLINICAL DATA: Right lower quadrant abdominal pain since yesterday.
Some nausea vomiting. History of chronic kidney disease.

EXAM:
CT ABDOMEN AND PELVIS WITHOUT CONTRAST
TECHNIQUE: Multidetector CT imaging of the abdomen and pelvis was performed
following the standard protocol without IV contrast.

[Series 2: stone full standard · axial · 0.89mm/px · z∈[-984,-549]mm · 12 of 95 slices shown, 14 images]
[im 4/95  soft-tissue]
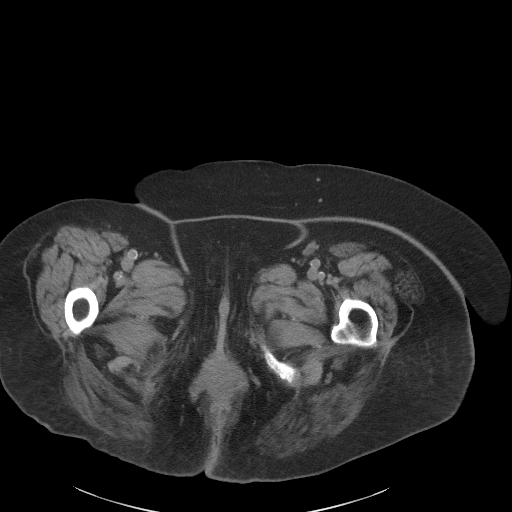
[im 4/95  bone]
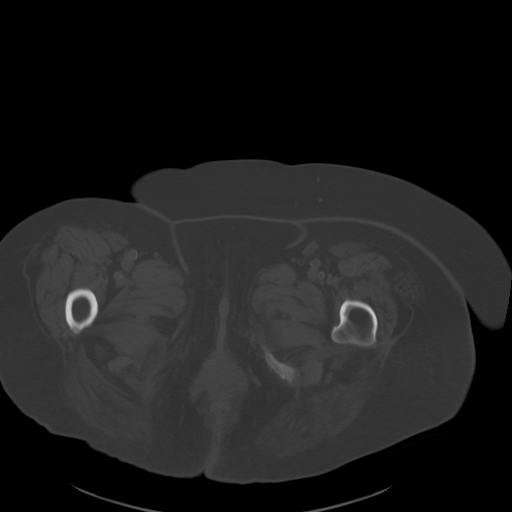
[im 12/95  soft-tissue]
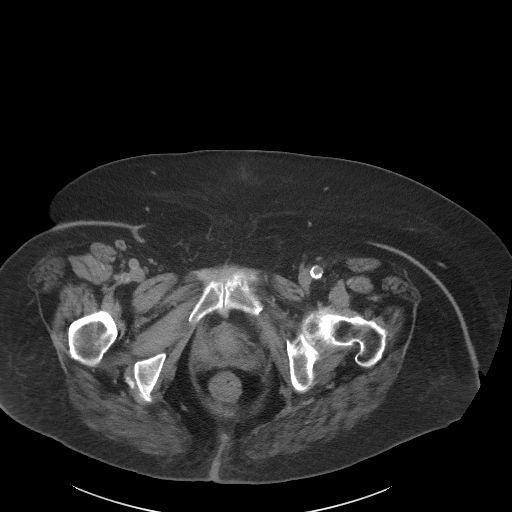
[im 20/95  soft-tissue]
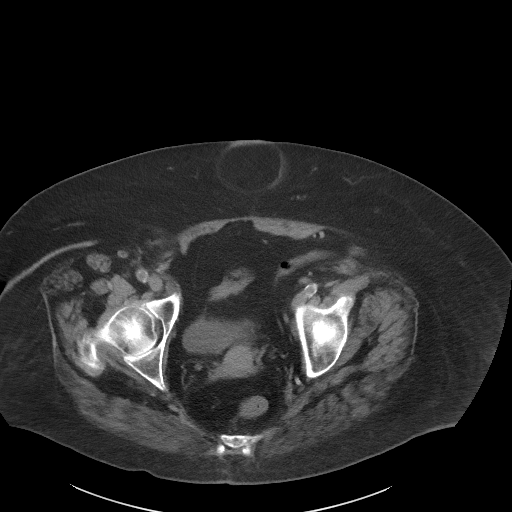
[im 28/95  soft-tissue]
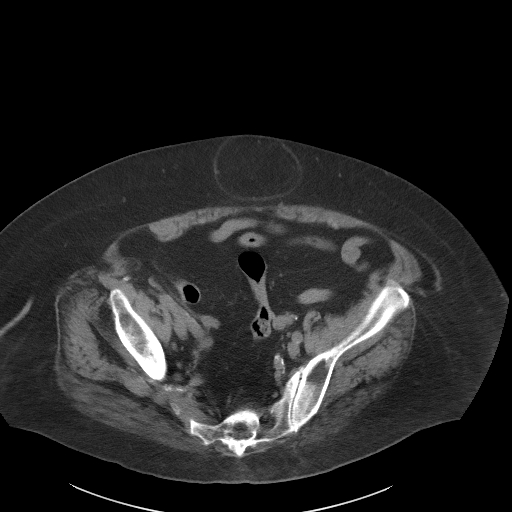
[im 36/95  soft-tissue]
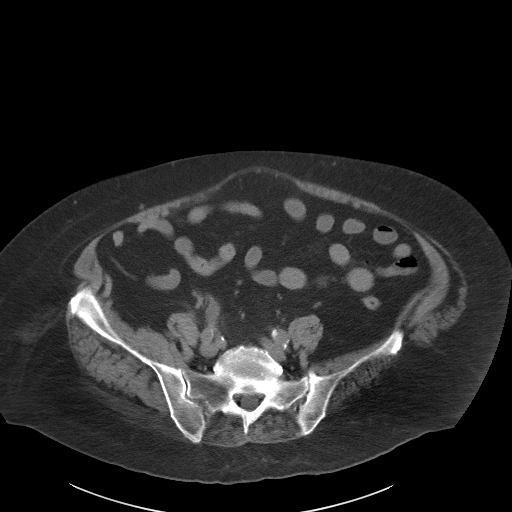
[im 44/95  soft-tissue]
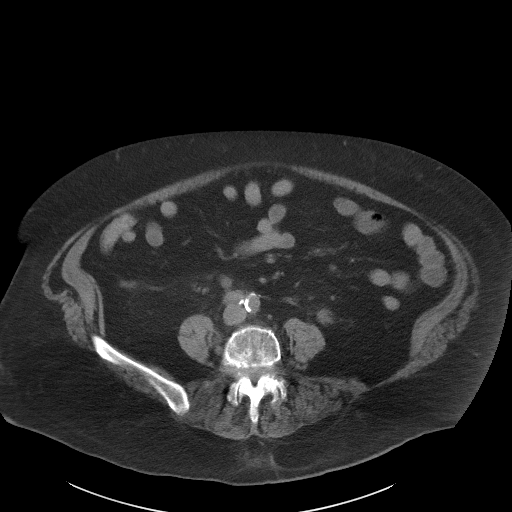
[im 51/95  soft-tissue]
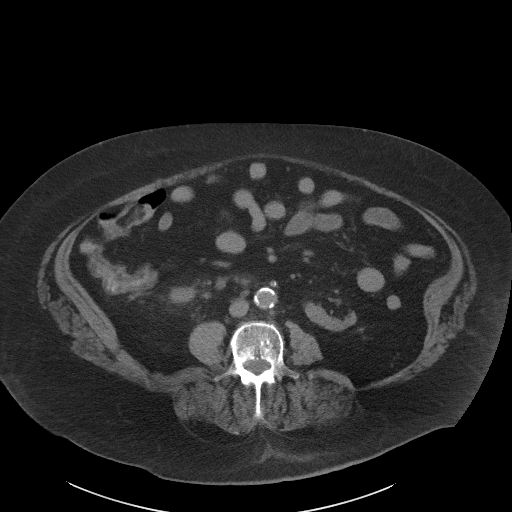
[im 59/95  soft-tissue]
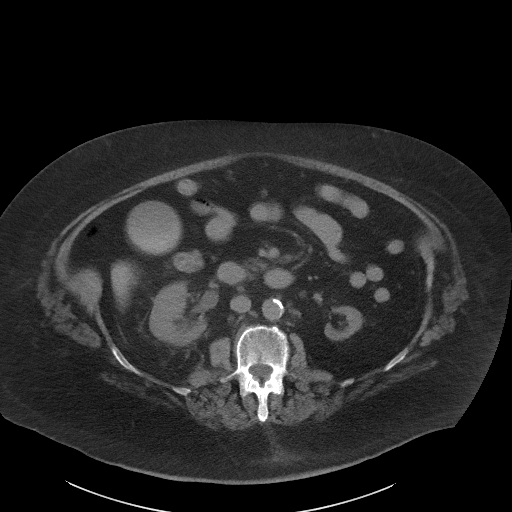
[im 67/95  soft-tissue]
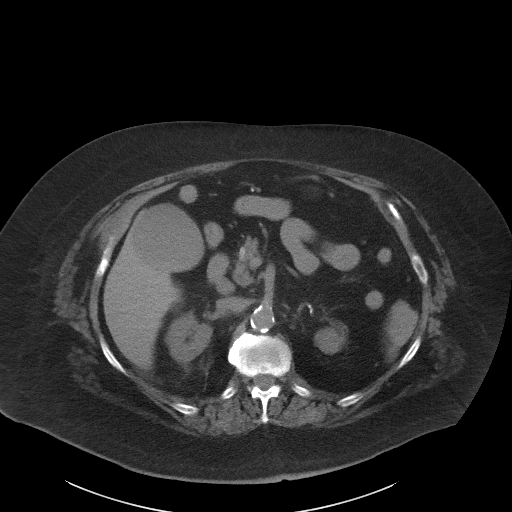
[im 67/95  bone]
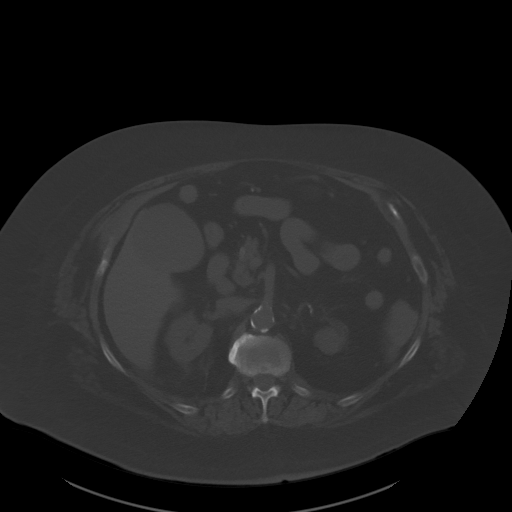
[im 75/95  soft-tissue]
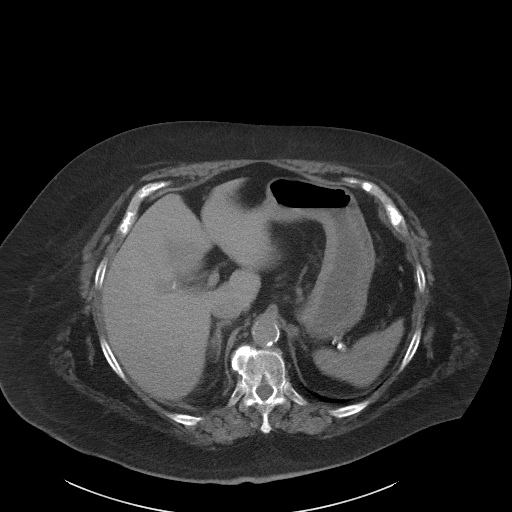
[im 83/95  soft-tissue]
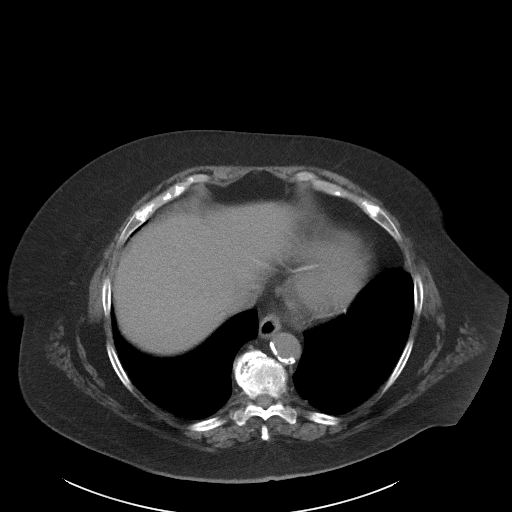
[im 91/95  soft-tissue]
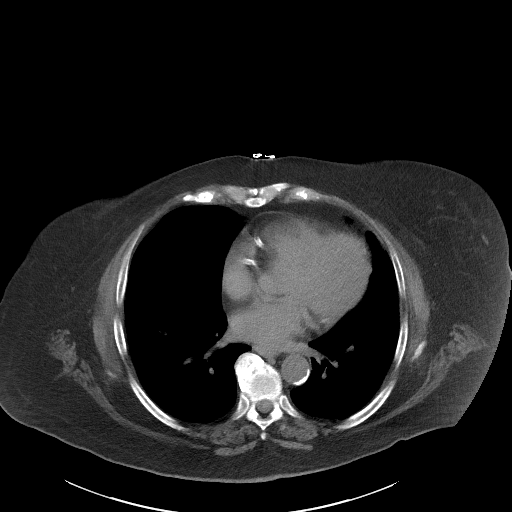

[Series 5: coronal · coronal · 0.86mm/px · 3 of 149 slices shown]
[im 50/149  soft-tissue]
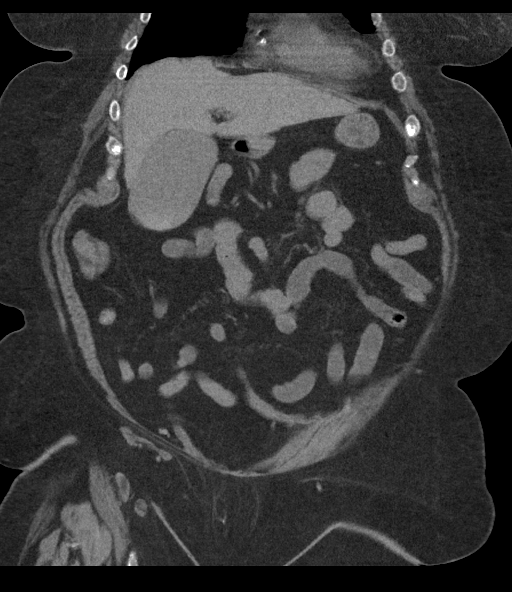
[im 66/149  soft-tissue]
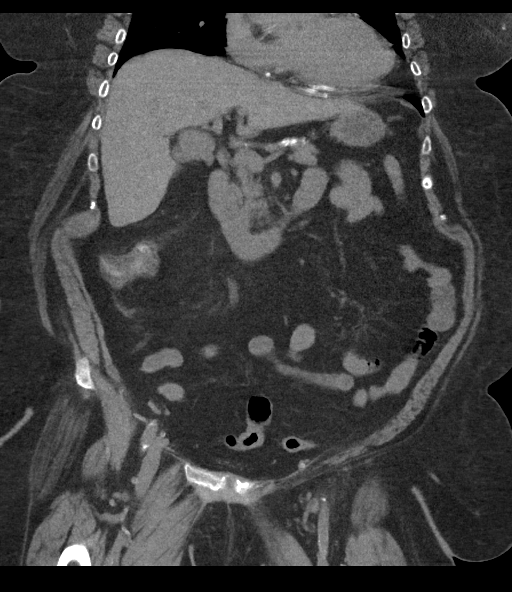
[im 83/149  soft-tissue]
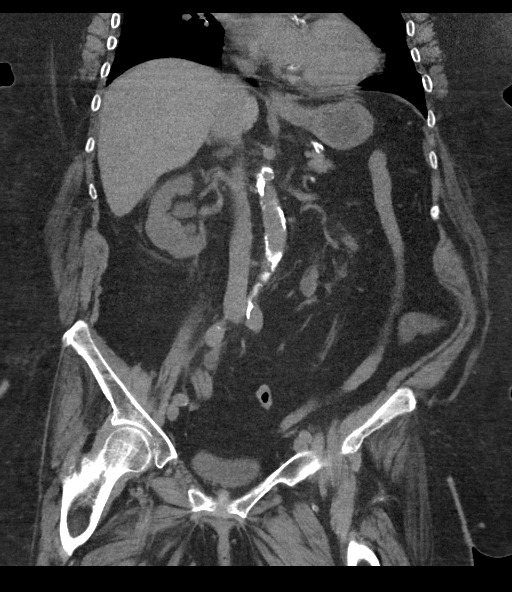

[15 of 46 positions shown; findings below may reference images not displayed]

FINDINGS: Lower chest: No acute abnormality.

Hepatobiliary: Liver is unremarkable. Gallbladder is distended. No
convincing gallstones. No wall thickening or adjacent inflammation.
No bile duct dilation.

Pancreas: Unremarkable. No pancreatic ductal dilatation or
surrounding inflammatory changes.

Spleen: Normal in size. Several calcifications consistent with
healed granuloma. Otherwise unremarkable.

Adrenals/Urinary Tract: No adrenal masses.

There is moderate right hydronephrosis associated with mild right
renal swelling and right perinephric stranding. There is moderate
right hydroureter. There is no definite ureteral stone. There is
heterogeneous mild increased attenuation in the distal ureter which
could reflect small mildly radiopaque stones but is nonspecific. No
intrarenal stone on the right. The left kidney is elongated with a
duplicated system. There is diffuse left renal cortical thinning.
And nonobstructing stones noted in the midpole region. Focal contour
bulge with a questionable low-attenuation mass is noted along the
medial lower pole. This may reflect a cyst. It measures approximate
12 mm. No other renal masses. No left hydronephrosis. Left ureter is
normal course and in caliber. No bladder masses or stones.

Stomach/Bowel: Stomach and small bowel are unremarkable. Colon is
mostly decompressed. No colonic wall thickening or inflammation.
Normal appendix visualized.

Vascular/Lymphatic: Mildly enlarged peripancreatic lymph node
adjacent to the pancreatic head measuring 15 mm in short axis. No
other adenopathy. There is aortic atherosclerosis.

Reproductive: Uterus and adnexa are unremarkable.

Other: Fat containing umbilical hernia. No other hernia. No ascites.

Musculoskeletal: No fracture or acute finding. No osteoblastic or
osteolytic lesions. There are significant degenerative changes
throughout visualized spine. Bones are demineralized.
IMPRESSION: 1. Moderate right hydroureteronephrosis. No defined ureteral stone.
There is subtle heterogeneous increased attenuation in the distal
right ureter, which may reflect minimally radiopaque stones, but is
nonspecific. These findings are associated with mild right renal
swelling and right perinephric stranding.
2. No other acute findings.
3. Nonobstructing left renal stone.
4. Distended gallbladder without evidence of acute cholecystitis.
5. Single mildly enlarged peripancreatic lymph node, most likely
reactive.
6. Aortic atherosclerosis.
